# Patient Record
Sex: Female | Born: 1985 | Race: White | Hispanic: No | Marital: Married | Smoking: Never smoker
Health system: Southern US, Community
[De-identification: ages and names within clinical notes are randomized; demographics above are authoritative.]

## PROBLEM LIST (undated history)

## (undated) DIAGNOSIS — IMO0002 Reserved for concepts with insufficient information to code with codable children: Secondary | ICD-10-CM

## (undated) DIAGNOSIS — R87619 Unspecified abnormal cytological findings in specimens from cervix uteri: Secondary | ICD-10-CM

## (undated) HISTORY — DX: Unspecified abnormal cytological findings in specimens from cervix uteri: R87.619

## (undated) HISTORY — DX: Reserved for concepts with insufficient information to code with codable children: IMO0002

---
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
## 2003-08-29 ENCOUNTER — Inpatient Hospital Stay (HOSPITAL_COMMUNITY): Admission: EM | Admit: 2003-08-29 | Discharge: 2003-08-30 | Payer: Self-pay | Admitting: Emergency Medicine

## 2003-08-30 ENCOUNTER — Encounter: Payer: Self-pay | Admitting: Internal Medicine

## 2003-08-30 ENCOUNTER — Inpatient Hospital Stay (HOSPITAL_COMMUNITY): Admission: AD | Admit: 2003-08-30 | Discharge: 2003-09-02 | Payer: Self-pay | Admitting: Emergency Medicine

## 2004-01-13 ENCOUNTER — Other Ambulatory Visit: Admission: RE | Admit: 2004-01-13 | Discharge: 2004-01-13 | Payer: Self-pay | Admitting: Obstetrics and Gynecology

## 2005-11-30 ENCOUNTER — Encounter: Admission: RE | Admit: 2005-11-30 | Discharge: 2005-11-30 | Payer: Self-pay | Admitting: Internal Medicine

## 2007-06-13 ENCOUNTER — Other Ambulatory Visit: Admission: RE | Admit: 2007-06-13 | Discharge: 2007-06-13 | Payer: Self-pay | Admitting: Family Medicine

## 2008-09-22 ENCOUNTER — Ambulatory Visit: Payer: Self-pay | Admitting: Sports Medicine

## 2008-09-22 DIAGNOSIS — M779 Enthesopathy, unspecified: Secondary | ICD-10-CM | POA: Insufficient documentation

## 2008-09-22 DIAGNOSIS — M21969 Unspecified acquired deformity of unspecified lower leg: Secondary | ICD-10-CM | POA: Insufficient documentation

## 2008-09-22 DIAGNOSIS — M2170 Unequal limb length (acquired), unspecified site: Secondary | ICD-10-CM | POA: Insufficient documentation

## 2009-11-27 HISTORY — PX: AUGMENTATION MAMMAPLASTY: SUR837

## 2010-08-15 ENCOUNTER — Ambulatory Visit: Payer: Self-pay | Admitting: Internal Medicine

## 2010-08-15 DIAGNOSIS — F509 Eating disorder, unspecified: Secondary | ICD-10-CM | POA: Insufficient documentation

## 2010-08-15 DIAGNOSIS — F3289 Other specified depressive episodes: Secondary | ICD-10-CM | POA: Insufficient documentation

## 2010-08-15 DIAGNOSIS — K5900 Constipation, unspecified: Secondary | ICD-10-CM | POA: Insufficient documentation

## 2010-08-15 DIAGNOSIS — F329 Major depressive disorder, single episode, unspecified: Secondary | ICD-10-CM

## 2010-09-28 ENCOUNTER — Ambulatory Visit: Payer: Self-pay | Admitting: Internal Medicine

## 2010-12-27 NOTE — Assessment & Plan Note (Signed)
Summary: New    Vital Signs:  Patient profile:   25 year old female Height:      62 inches (157.48 cm) Weight:      127.0 pounds (57.73 kg) BMI:     23.31 O2 Sat:      95 % on Room air Temp:     97.0 degrees F (36.11 degrees C) oral Pulse rate:   74 / minute BP sitting:   102 / 60  (left arm) Cuff size:   regular  Vitals Entered By: Orlan Leavens RMA (August 15, 2010 3:19 PM)  O2 Flow:  Room air CC: New patient Is Patient Diabetic? No Pain Assessment Patient in pain? no        Primary Care Provider:  Newt Lukes MD  CC:  New patient.  History of Present Illness: new pt to me and our practice, here to est care  1) depression - hx same,  prev self tx with substance abuse - sober since 2009 on wellbutrin since start of 2011 - doing well - prior med trial prozac - "numb" so stopped med still spells of sadness and low energy - but much improved compared to pretx self also complicated by anxiety spells - no worse since starting tx reports compliance with ongoing medical treatment and no changes in medication dose or frequency. denies adverse side effects related to current therapy.  would like to eventually wean off med if possible  2) RLQ pain -  onset 3 weeks ago saw urg care for same - tx with miralax and fiber and improved but not resolved - no fever, no n/v, no radiation of pain - no missed menstral cycles no bowel changes - no melena or BRBPR  3) hx eating do - bulemia in college - prev laxative abuse - no purging or anorexia denies active weight concerns  Preventive Screening-Counseling & Management  Alcohol-Tobacco     Alcohol drinks/day: 0     Alcohol Counseling: not indicated; patient does not drink     Smoking Status: never     Tobacco Counseling: not indicated; no tobacco use  Caffeine-Diet-Exercise     Times/week: 6     Exercise Counseling: not indicated; exercise is adequate     Depression Counseling: further diagnostic testing and/or  other treatment is indicated  Current Medications (verified): 1)  Wellbutrin Xl 150 Mg Xr24h-Tab (Bupropion Hcl) .... Take 1 By Mouth Once Daily 2)  Calcium 600 600 Mg Tabs (Calcium Carbonate) .... Take 1 By Mouth Once Daily 3)  Flaxseed Oil 1000 Mg Caps (Flaxseed (Linseed)) .... Take 1 By Mouth Once Daily 4)  Vitamin D 1000 Unit Tabs (Cholecalciferol) .... Take 1 By Mouth Once Daily 5)  Multivitamins  Tabs (Multiple Vitamin) .... Take 1 By Mouth Once Daily  Allergies (verified): 1)  ! 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    Physical Exam  General:  alert,  well-developed, well-nourished, and cooperative to examination.    Eyes:  vision grossly intact; pupils equal, round and reactive to light.  conjunctiva and lids normal.    Lungs:  normal respiratory effort, no intercostal retractions or use of accessory muscles; normal breath sounds bilaterally - no crackles and no wheezes.    Heart:  normal rate, regular rhythm, no murmur, and no rub. BLE without edema. normal DP pulses and normal cap refill in all 4 extremities    Abdomen:  soft, non-tender, normal bowel sounds, no distention; no masses and no appreciable hepatomegaly or splenomegaly.   Psych:  Oriented X3, memory intact for recent and remote, normally interactive, good eye  contact, not anxious appearing, not depressed appearing, and not agitated.      Impression & Recommendations:  Problem # 1:  DEPRESSION (ICD-311)  well controlled at this time on current tx - substance abuse hx reviewed - cont AA also situational component of grief related to unexpected death of dad 03-16-2010 in MVA can revist weaning off med after holidays, winter and anniversary of dad's death if pt stilldesires but would cont same for now discussed w/ pt who agrees Her updated medication list for this problem includes:    Wellbutrin Xl 150 Mg Xr24h-tab (Bupropion hcl) .Marland Kitchen... Take 1 by mouth once daily  Orders: Prescription Created Electronically [phone])  Problem # 2:  CONSTIPATION (ICD-564.00) likely cause of RLQ pain - exam and hx benign - pt follows gluten free diet which helps with bloating and gas advised contined use of fiber and try probiotics -  Problem # 3:  Hx of EATING DISORDER (ICD-307.50) hx same in college - now controls watching diet and exercise - would need to consider weight neutral drug if considering alt med for tx depression/anxiety-- reviewed same with pt today  Time spent with patient 30 minutes, more than 50% of this time was spent counseling patient on depression, subst abuse hx and problems concerning the need for med adjustment over time  Complete Medication List: 1)  Wellbutrin Xl 150 Mg Xr24h-tab (Bupropion hcl) .... Take 1 by mouth once daily 2)  Calcium 600 600 Mg Tabs (Calcium carbonate) .... Take 1 by mouth once daily 3)  Flaxseed Oil 1000 Mg Caps (Flaxseed (linseed)) .... Take 1 by mouth once daily 4)  Vitamin D 1000 Unit Tabs (Cholecalciferol) .... Take 1 by mouth once daily 5)  Multivitamins Tabs (Multiple vitamin) .... Take 1 by mouth once daily 6)  Align Caps (Probiotic product) .Marland Kitchen.. 1 by mouth once daily  Patient Instructions: 1)  it was good to see you today. 2)  medications and history reviewed -  3)  try Align (sample+coupon provided)  or [employer] as discussed to help with your GI health 4)  continue generic wellbutrin - refills done - will discuss in spring about weaning off medication if time is right for you 5)  Please schedule a follow-up appointment in 6-9 months to review depression and medications, call sooner if problems.  Prescriptions: WELLBUTRIN XL 150 MG XR24H-TAB (BUPROPION HCL) take 1 by mouth once daily  #30 x 11   Entered and Authorized by:   Newt Lukes MD   Signed by:   Newt Lukes MD on 08/15/2010   Method used:   Electronically to        [facility]. #[phone]* (retail)       [address]       [place]  [place], [place]  [address]       Ph: [phone]       Fax: [phone]   RxID:   [id number]

## 2010-12-27 NOTE — Assessment & Plan Note (Signed)
Summary: PER DAHLIA FU ANXIETY  STC   Vital Signs:  Patient profile:   25 year old female Height:      62 inches (157.48 cm) Weight:      127 pounds (57.73 kg) O2 Sat:      97 % on Room air Temp:     97.4 degrees F (36.33 degrees C) oral Pulse rate:   70 / minute BP sitting:   92 / 62  (left arm) Cuff size:   regular  Vitals Entered By: Orlan Leavens RMA (September 28, 2010 8:54 AM)  O2 Flow:  Room air CC: F/U on anxiety Is Patient Diabetic? No Pain Assessment Patient in pain? no        Primary Care Provider:  Newt Lukes MD  CC:  F/U on anxiety.  History of Present Illness: here for f/u  depression - hx same- prev self tx with substance abuse - sober since 2009 on wellbutrin since start of 2011 - doing well until insertion of hormonal IUD few weeks ago- prior med trial prozac - "numb" so stopped med has improved spells of sadness and low energy initially-  overall much improved compared to pretx self now complicated by anxiety spells - much worse since new IUD would like to change to different medication for anxiety control but avoid BZ if possible no SI/HI  hx eating do - bulemia in college - prev laxative abuse - no purging or anorexia denies active weight concerns  Current Medications (verified): 1)  Wellbutrin Xl 150 Mg Xr24h-Tab (Bupropion Hcl) .... Take 1 By Mouth Once Daily 2)  Calcium 600 600 Mg Tabs (Calcium Carbonate) .... Take 1 By Mouth Once Daily 3)  Flaxseed Oil 1000 Mg Caps (Flaxseed (Linseed)) .... Take 1 By Mouth Once Daily 4)  Vitamin D 1000 Unit Tabs (Cholecalciferol) .... Take 1 By Mouth Once Daily 5)  Phillips Colon Health  Caps (Probiotic Product) .... Take 1 By Mouth Once Daily  Allergies (verified): 1)  ! Penicillin  Past History:  Past Medical History: Depression/anxiety hx subst abuse - sober 2009 hx eating disorder  MD roster: gyn - carol curtis, [facility]; prev [facility]  Review of Systems  The patient denies  weight loss, chest pain, and headaches.    Physical Exam  General:  alert, well-developed, well-nourished, and cooperative to examination.    Psych:  Oriented X3, memory intact for recent and remote, normally interactive, good eye contact, min anxious appearing, not depressed appearing, and not agitated.      Impression & Recommendations:  Problem # 1:  DEPRESSION (ICD-311)  Her updated medication list for this problem includes:    Bupropion Hcl 75 Mg Tabs (Bupropion hcl) .Marland Kitchen... 1 by mouth once daily x 2 weeks (or as discussed)    Venlafaxine Hcl 37.5 Mg Xr24h-tab (Venlafaxine hcl) .Marland Kitchen... 1 by mouth once daily x 2 weeks then 1 by mouth two times a day (or as directed)  exac of anxiety at this time on current tx - ?related to new IUD - will d/w gyn re: same possibility substance abuse hx reviewed - cont AA also situational component of grief related to unexpected death of dad Mar 16, 2010 in MVA will transitionmed from wellbutrin to effexor - low dose of each to help transition - erx done can revist weaning off med after holidays, winter and anniversary of dad's death if pt stilldesires but would cont same for now discussed w/ pt who agrees  Orders: Administrator, arts (  Chance.Elder)  Complete Medication List: 1)  Bupropion Hcl 75 Mg Tabs (Bupropion hcl) .Marland Kitchen.. 1 by mouth once daily x 2 weeks (or as discussed) 2)  Calcium 600 600 Mg Tabs (Calcium carbonate) .... Take 1 by mouth once daily 3)  Flaxseed Oil 1000 Mg Caps (Flaxseed (linseed)) .... Take 1 by mouth once daily 4)  Vitamin D 1000 Unit Tabs (Cholecalciferol) .... Take 1 by mouth once daily 5)  Phillips Colon Health Caps (Probiotic product) .... Take 1 by mouth once daily 6)  Venlafaxine Hcl 37.5 Mg Xr24h-tab (Venlafaxine hcl) .Marland Kitchen.. 1 by mouth once daily x 2 weeks then 1 by mouth two times a day (or as directed)  Patient Instructions: 1)  it was good to see you today. 2)  medications and history reviewed -  3)  wean off  wellbutrin and start effexor for anxiety as discussed (see below) - your prescriptions have been electronically submitted to your pharmacy. Please take as directed. Contact our office if you believe you're having problems with the medication(s).  4)  continue [facility] as previously discussed to help with your GI health 5)  talk with gynecology about your IUD 6)  we will discuss in spring 2012 about weaning off medications if time is right for you 7)  Please schedule a follow-up appointment in 4 weeks to review depression/anxiety and medications, call sooner if problems.  Prescriptions: VENLAFAXINE HCL 37.5 MG XR24H-TAB (VENLAFAXINE HCL) 1 by mouth once daily x 2 weeks then 1 by mouth two times a day (or as directed)  #60 x 1   Entered and Authorized by:   Newt Lukes MD   Signed by:   Newt Lukes MD on 09/28/2010   Method used:   Electronically to        [facility] [phone]* (retail)       [address]       [place], [place]  [address]       Ph: [phone]       Fax: [phone]   RxID:   [id number] BUPROPION HCL 75 MG TABS (BUPROPION HCL) 1 by mouth once daily x 2 weeks (or as discussed)  #30 x 0   Entered and Authorized by:   Newt Lukes MD   Signed by:   Newt Lukes MD on 09/28/2010   Method used:   Electronically to        [facility] [phone]* (retail)       [address]       [place], [place]  [address]       Ph: [phone]       Fax: [phone]   RxID:   [id number]    Orders Added: 1)  Est. Patient Level IV [[address]] 2)  Prescription Created Electronically [phone]

## 2011-04-14 NOTE — Discharge Summary (Signed)
NAME:  Colleen Harris, Colleen Harris NO.:  [id number]   MEDICAL RECORD NO.:  [id number]                   PATIENT TYPE:  IPS   LOCATION:  0301                                 FACILITY:  [facility]   PHYSICIAN:  Jeanice Lim, M.D.              DATE OF BIRTH:  [birth year]   DATE OF ADMISSION:  08/30/2003  DATE OF DISCHARGE:  09/02/2003                                 DISCHARGE SUMMARY   IDENTIFYING DATA:  A 25 year old Caucasian female voluntarily admitted in  transfer from medicine after overdosing on two Ritalin, 10 Strattera, 24  Tylenol.  Tylenol level was 130 in the morning serum.  Overdose spaced out  over a period of time.  Reported two weeks of increased stress, decreased  sleep, and increased alcohol use drinking in two binges.  Today reporting  being remorseful at the time of admission regarding the overdose.   MEDICATIONS:  Strattera, does not think it helps, and Ritalin but wonders if  it makes things worse.   DRUG ALLERGIES:  No known drug allergies.   PHYSICAL EXAMINATION:  GENERAL:  Essentially within normal limits.  NEUROLOGIC:  Nonfocal.   LABORATORY DATA:  Routine admission labs within normal limits.   MENTAL STATUS EXAM:  Fully alert pleasant, polite, petite, good hygiene.  Affect appropriate.  Speech within normal limits.  Mood:  Euthymic.  Thought  process:  Goal directed.  Negative for dangerous ideation or psychotic  symptoms.  The patient reported being busy with planning time and how to  cope with stressors, still recovering from Tylenol overdose.   ADMISSION DIAGNOSES:   AXIS I:  Adjustment disorder, not otherwise specified versus substance-  induced mood disorder, cannabis abuse.   AXIS II:  None.   AXIS III:  Status post acetaminophen overdose and hyperkalemia.   AXIS IV:  Moderate stressors related to primary support system and social  environment.   AXIS V:  30/70.   HOSPITAL COURSE:  The patient was admitted, ordered routine  p.r.n.  medications, underwent further monitoring, and was encouraged to participate  in individual, group, and milieu therapy.  The patient is status post near-  fatal overdose on Tylenol, had two binges, has a history of ADD and reports  that she may do best just with therapy and avoiding substance use, showing  an increased insight regarding impact of substance use on her judgment,  mood.  The patient participated in treatment, participated in aftercare  planning, and reported a gradual improvement in mood, as well as planned  good followup system with [employer].  The patient was discharged in improved  condition.  Mood euthymic, affect brighter, thought process goal directed,  thought content negative for dangerous ideation or psychotic symptoms.  The  patient was discharged on no psychotropics, was advised to avoid alcohol and  get therapy regarding ADD and stress management.  The patient was to call  the  psychology clinic at [facility] and schedule an appointment with them in 7-10  days.   DISCHARGE DIAGNOSES:   AXIS I:  Adjustment disorder, not otherwise specified versus substance-  induced mood disorder, cannabis abuse.   AXIS II:  None.   AXIS III:  Status post acetaminophen overdose and hyperkalemia.   AXIS IV:  Moderate stressors related to primary support system and social  environment.   AXIS V:  GAF on discharge 55.                                               Jeanice Lim, M.D.    JEM/MEDQ  D:  09/30/2003  T:  10/01/2003  Job:  [id number]

## 2011-04-14 NOTE — H&P (Signed)
NAME:  Colleen Harris, Colleen Harris NO.:  [id number]   MEDICAL RECORD NO.:  [id number]                   PATIENT TYPE:  EMS   LOCATION:  MINO                                 FACILITY:  [facility]   PHYSICIAN:  Hollice Espy, M.D.            DATE OF BIRTH:  04-28-1986   DATE OF ADMISSION:  08/29/2003  DATE OF DISCHARGE:                                HISTORY & PHYSICAL   HISTORY OF PRESENT ILLNESS:  This is a 25 year old white female with a past  medical history of ADHD who presents after taking an overdose on her ADHD  medications as well as Tylenol.  The patient states that she had been doing  well, but she had been under a lot of stress at school and things initially  started out well, but they had quickly gone downhill to the point where she  felt that she just could not deal with this anymore.  She took two of her  methylphenidate, ten of Strattera which is a norepinephrine reuptake  inhibitor for ADHD, two ephedra diet pills which she had been taking on  again and off again and 24 Tylenol.  This all occurred last night spaced out  at different times.  The Tylenol was taken per the patient at approximately  2 a.m.  She then felt very tired and passed out on the [place] her  camp[place].  Her roommate reported her as missing and called her mother.  The  patient was found and brought into the ED this morning.  She is completely  alert, awake and oriented.  She complained of feeling nauseous and slightly  jumpy.  In the time in the ER her Tylenol level was found to be 130.  Her  LFTs were within normal limits and the rest of her laboratory work including  a CBC and CHem-7 were all normal.  She was started on N-acetylcysteine and  received 8 grams at approximately 11:30 a.m.  She also received some  Phenergan 12.5 mg which did help with her nausea.  Currently, the patient  states that she feels tired although she feels like she does have some  palpitations.  She  denies any abdominal pain.  She complains of some mild  nausea.  Otherwise, she has no vision changes, hearing changes, trouble  swallowing, chest pain, shortness of breath, wheezing, cough, abdominal  pain, fevers, chills, hematuria, dysuria, constipation or diarrhea.   PAST MEDICAL HISTORY:  1. She has ADHD.  Once when she was much younger she accidentally ingested a     bottle of Pepto-Bismol at age 25.   MEDICATIONS:  1. Strattera.  2. Birth control pills.  3. Hydroxycut which is an ephedrine diet drug derivative occasionally.   ALLERGIES:  1. PENICILLIN.   SOCIAL HISTORY:  She denies any type of heavy IV drug use.  She does state  that she smokes marijuana occasionally.  She occasionally smokes cigarettes  and she occasionally drinks alcohol, but not excessively.   FAMILY HISTORY:  Noncontributory.   PHYSICAL EXAMINATION:  VITAL SIGNS ON ADMISSION:  She is afebrile,  tachycardic with a ventricular rate of 105, respirations of 18, 02  saturations are 99% on room air.  GENERAL: She is in no apparent distress and is alert and oriented times  three and pleasant.  HEENT:  Her mucous membranes are dry.  She has no carotid bruits.  HEART:  Regular rhythm with mild tachycardia.  LUNGS:  Clear to auscultation bilaterally.  ABDOMEN:  Soft, non-tender and non-distended with positive bowel sounds.  She has no organomegaly.  EXTREMITIES:  She has no cyanosis, clubbing or edema.   LABORATORY DATA:  Alcohol level was less than 5.0 which is normal.  Tylenol  level is 130.7 with the normal range of 10 to 30. Salicylate level is less  than 4.0 which is normal.  Urine pregnancy test is negative.  CBC:  White  count is 4.0 with a 59% shift, hemoglobin 12.7, hematocrit 37.0, MCV 90,  platelet count 184,000.  PT 12.8, INR 0.9 and PTT 28.0, all normal.  Urine  drug screen is positive only for THC.  Her pH is 7.41, pC02 is 35.1 and  bicarbonate is 22.  Sodium 137, potassium 3.8, chloride 117,  bicarbonate 22.  Total bilirubin 0.3, direct bilirubin less than 0.1, alkaline phosphatase  81, AST 33, ALT 24, total protein 7.5, albumin 3.9, all within normal  limits.   ASSESSMENT AND PLAN:  1. 25-year-old white female who presents after taking two Concerta,     ten Strattera, two ephedrine and 24 Tylenol as a suicide attempt     secondary to stress with a noted elevated Tylenol level.  The patient has     been started on N-acetylcysteine.  After an initial dose we will give 4     grams of NAC times 17 doses q.4h.  We will continue to follow levels and     liver function tests and telemetry bed. We will ask psychiatry to see.     We will treat her nausea with Phenergan.  We will give her a sitter.  She     appears relatively medically stable for now.  In regards to the other     medications she took which accounts for her tachycardia we will just give     her IV fluids and let it just work out of her system and follow her with     a telemetry bed.  She may need some inpatient psychiatric placement and     we will allow psychiatry to determine this.                                                Hollice Espy, M.D.    SKK/MEDQ  D:  08/29/2003  T:  08/30/2003  Job:  [id number]

## 2011-04-14 NOTE — Discharge Summary (Signed)
NAME:  Colleen Harris, Colleen Harris NO.:  [id number]   MEDICAL RECORD NO.:  [id number]                   PATIENT TYPE:  INP   LOCATION:  6704                                 FACILITY:  [facility]   PHYSICIAN:  Hollice Espy, M.D.            DATE OF BIRTH:  Mar 21, 1986   DATE OF ADMISSION:  08/29/2003  DATE OF DISCHARGE:  08/30/2003                                 DISCHARGE SUMMARY   PRIMARY PHYSICIAN:  Dr. Elana Alm. Reade.   DISCHARGE DIAGNOSIS:  Tylenol overdose.   DISCHARGE MEDICATIONS:  1. Mucomyst 4 g p.o. q.4 h. -- continue this until final dose at noon on     Tuesday, September 01, 2003.  2. The patient may resume birth control pills in one week.  3. She may resume her Strattera medications for her ADHD as per her primary     care doctor.  4. She is advised to stop taking her ephedrine diet pills.   DIET:  Her diet is regular.   ACTIVITY:  Activity is as tolerated.   HOSPITAL COURSE:  This is a 25 year old white female with past medical  history of ADHD, who presented after taking an overdose of  Strattera/Concerta, which are medications for ADHD, plus 2 ephedra and 24  Tylenol.  The patient stated that she had been in college and was undergoing  some stresses and felt like she could not take it any more.  She consumed  these medications approximately about 2 a.m. on the morning of August 29, 2003.  She was notified missing by her roommate.  Her mother was called and  she was found and brought into the ED.  Her LFTs were checked and were  normal.  Her Tylenol level was elevated at 130.  She received 8 g of  Mucomyst as well as IV fluids.  She was awake and alert and conscious during  her entire time when she was admitted.  She reported no other problems.  On  admission, she was noted to be slightly tachycardic, which was felt to be  secondary to her ADHD medications plus the ephedrine.  She was given IV  fluids and continued to do well.  By 5 p.m. on  August 29, 2003, her Tylenol  level was normal; her LFTs were never elevated.  She was felt to be  medically stable.  The ACT team and Behavioral Health were called and  assessed the patient and she was seen by a psychiatrist today on August 30, 2003.  She is felt to be medically stable for discharge and was accepted for  transfer on the evening of August 30, 2003.  She will continue on her  Mucomyst, receiving Mucomyst every four hours since her admission due to her  elevated initial Tylenol level, until 17 additional doses are complete; this  will be done at noon on Tuesday, September 01, 2003.  DISPOSITION:  Improved.  She will be discharged to [facility].   FOLLOWUP:  She is advised to follow up with her PCP, Dr. Elias Else,  within the first week after her discharge.    DISCHARGE INSTRUCTIONS:  She may resume normal activity at that time.  She  is additionally advised, as her birth control pills are usually hepatically  cleared, to restart her birth control pills, either as per her PCP or one  week after she is discharged.  She is advised not to resume sexual activity  for the next few weeks until her birth control pills are at a regulated  level.                                                Hollice Espy, M.D.    SKK/MEDQ  D:  08/30/2003  T:  08/31/2003  Job:  [id number]

## 2011-07-29 HISTORY — PX: BREAST SURGERY: SHX581

## 2012-02-09 ENCOUNTER — Ambulatory Visit (INDEPENDENT_AMBULATORY_CARE_PROVIDER_SITE_OTHER): Payer: BC Managed Care – PPO | Admitting: Physician Assistant

## 2012-02-09 DIAGNOSIS — Z111 Encounter for screening for respiratory tuberculosis: Secondary | ICD-10-CM

## 2012-02-09 NOTE — Progress Notes (Signed)
   Patient ID: MCKAILA DUFFUS MRN: [id number], DOB: 1986-10-14, 26 y.o. Date of Encounter: 02/09/2012, 12:29 PM  Primary Physician: Rene Paci, MD, MD  Chief Complaint: PPD reading  HPI: 26 y.o. year old female here for PPD reading. Had PPD placed at [facility] employee health. Just got hired on there. PPD placed on 02/07/12. Asymptomatic. Never with a positive PPD. She is doing well and without issues or complaints. She requests that we fax her PPD results and TDaP immunization record to [facility] as well. She has filled out the authorization form.   No past medical history on file.   Home Meds: Prior to Admission medications   Not on File    Allergies:  Allergies  Allergen Reactions  . Penicillins     History   Social History  . Marital Status: Married    Spouse Name: N/A    Number of Children: N/A  . Years of Education: N/A   Occupational History  . Not on file.   Social History Main Topics  . Smoking status: Not on file  . Smokeless tobacco: Not on file  . Alcohol Use: Not on file  . Drug Use: Not on file  . Sexually Active: Not on file   Other Topics Concern  . Not on file   Social History Narrative  . No narrative on file     Review of Systems: Non-contributory   Physical Exam: There were no vitals taken for this visit., There is no height or weight on file to calculate BMI. General: Well developed, well nourished, in no acute distress. Head: Normocephalic, atraumatic, eyes without discharge, sclera non-icteric, nares are without discharge.   Neck: Supple.  Lungs: Breathing is unlabored. Heart: Regular rate Msk:  Strength and tone normal for age. Extremities/Skin: Warm and dry. No clubbing or cyanosis. No edema. No rashes or suspicious lesions. Neuro: Alert and oriented X 3. Moves all extremities spontaneously. Gait is normal. CNII-XII grossly in tact. Psych:  Responds to questions appropriately with a normal affect.     ASSESSMENT AND PLAN:    26 y.o. year old female here for PPD reading. -PPD 0 mm induration -Form completed and faxed to [facility] -TDaP immunization record faxed to [facility] as well   Signed, Eula Listen, PA-C 02/09/2012 12:29 PM

## 2012-08-02 ENCOUNTER — Encounter: Payer: Self-pay | Admitting: Obstetrics and Gynecology

## 2012-08-02 ENCOUNTER — Ambulatory Visit (INDEPENDENT_AMBULATORY_CARE_PROVIDER_SITE_OTHER): Payer: Self-pay | Admitting: Obstetrics and Gynecology

## 2012-08-02 VITALS — BP 93/57 | HR 64 | Ht 62.0 in | Wt 131.1 lb

## 2012-08-02 DIAGNOSIS — Z124 Encounter for screening for malignant neoplasm of cervix: Secondary | ICD-10-CM

## 2012-08-02 DIAGNOSIS — Z01419 Encounter for gynecological examination (general) (routine) without abnormal findings: Secondary | ICD-10-CM

## 2012-08-02 NOTE — Progress Notes (Signed)
  Subjective:     Colleen Harris is a 26 y.o. female G0 with amenorrhea secondary to Mirena IUD (since 2009) and BMI 23 who is here for a comprehensive physical exam. The patient reports no problems. She is sexually active with her husband without any issues. She is planning on removing the IUD in the next few months in order to achieve a pregnancy. Patient is without any complaints  History   Social History  . Marital Status: Married    Spouse Name: N/A    Number of Children: N/A  . Years of Education: N/A   Occupational History  . Not on file.   Social History Main Topics  . Smoking status: Never Smoker   . Smokeless tobacco: Not on file  . Alcohol Use: No  . Drug Use: No  . Sexually Active: Yes    Birth Control/ Protection: IUD   Other Topics Concern  . Not on file   Social History Narrative  . No narrative on file   Health Maintenance  Topic Date Due  . Pap Smear  04/30/2002  . Tetanus/tdap  05/01/2003  . Influenza Vaccine  08/27/2012   Past Medical History  Diagnosis Date  . Abnormal Pap smear     HPV had colpo 2011   Past Surgical History  Procedure Date  . Breast surgery 07/29/2011    AUGMENTATION        Review of Systems A comprehensive review of systems was negative.   Objective:     GENERAL: Well-developed, well-nourished female in no acute distress.  HEENT: Normocephalic, atraumatic. Sclerae anicteric.  NECK: Supple. Normal thyroid.  LUNGS: Clear to auscultation bilaterally.  HEART: Regular rate and rhythm. BREASTS: Symmetric in size. No palpable masses or lymphadenopathy, skin changes, or nipple drainage. Palpable implants ABDOMEN: Soft, nontender, nondistended. No organomegaly. PELVIC: Normal external female genitalia. Vagina is pink and rugated.  Normal discharge. Normal appearing cervix. IUD strings visulaized. Uterus is normal in size. No adnexal mass or tenderness. EXTREMITIES: No cyanosis, clubbing, or edema, 2+ distal pulses.    Assessment:     Healthy female exam.      Plan:    Pap smear collected Patient advised to perform monthly self breast and vulva exam  Patient advised to take prenatal vitamins prior to conception See After Visit Summary for Counseling Recommendations

## 2012-08-02 NOTE — Patient Instructions (Signed)
Preventive Care for Adults, Female A healthy lifestyle and preventive care can promote health and wellness. Preventive health guidelines for women include the following key practices.  A routine yearly physical is a good way to check with your caregiver about your health and preventive screening. It is a chance to share any concerns and updates on your health, and to receive a thorough exam.   Visit your dentist for a routine exam and preventive care every 6 months. Brush your teeth twice a day and floss once a day. Good oral hygiene prevents tooth decay and gum disease.   The frequency of eye exams is based on your age, health, family medical history, use of contact lenses, and other factors. Follow your caregiver's recommendations for frequency of eye exams.   Eat a healthy diet. Foods like vegetables, fruits, whole grains, low-fat dairy products, and lean protein foods contain the nutrients you need without too many calories. Decrease your intake of foods high in solid fats, added sugars, and salt. Eat the right amount of calories for you.Get information about a proper diet from your caregiver, if necessary.   Regular physical exercise is one of the most important things you can do for your health. Most adults should get at least 150 minutes of moderate-intensity exercise (any activity that increases your heart rate and causes you to sweat) each week. In addition, most adults need muscle-strengthening exercises on 2 or more days a week.   Maintain a healthy weight. The body mass index (BMI) is a screening tool to identify possible weight problems. It provides an estimate of body fat based on height and weight. Your caregiver can help determine your BMI, and can help you achieve or maintain a healthy weight.For adults 20 years and older:   A BMI below 18.5 is considered underweight.   A BMI of 18.5 to 24.9 is normal.   A BMI of 25 to 29.9 is considered overweight.   A BMI of 30 and above is  considered obese.   Maintain normal blood lipids and cholesterol levels by exercising and minimizing your intake of saturated fat. Eat a balanced diet with plenty of fruit and vegetables. Blood tests for lipids and cholesterol should begin at age 20 and be repeated every 5 years. If your lipid or cholesterol levels are high, you are over 50, or you are at high risk for heart disease, you may need your cholesterol levels checked more frequently.Ongoing high lipid and cholesterol levels should be treated with medicines if diet and exercise are not effective.   If you smoke, find out from your caregiver how to quit. If you do not use tobacco, do not start.   If you are pregnant, do not drink alcohol. If you are breastfeeding, be very cautious about drinking alcohol. If you are not pregnant and choose to drink alcohol, do not exceed 1 drink per day. One drink is considered to be 12 ounces (355 mL) of beer, 5 ounces (148 mL) of wine, or 1.5 ounces (44 mL) of liquor.   Avoid use of street drugs. Do not share needles with anyone. Ask for help if you need support or instructions about stopping the use of drugs.   High blood pressure causes heart disease and increases the risk of stroke. Your blood pressure should be checked at least every 1 to 2 years. Ongoing high blood pressure should be treated with medicines if weight loss and exercise are not effective.   If you are 55 to 28   years old, ask your caregiver if you should take aspirin to prevent strokes.   Diabetes screening involves taking a blood sample to check your fasting blood sugar level. This should be done once every 3 years, after age 45, if you are within normal weight and without risk factors for diabetes. Testing should be considered at a younger age or be carried out more frequently if you are overweight and have at least 1 risk factor for diabetes.   Breast cancer screening is essential preventive care for women. You should practice "breast  self-awareness." This means understanding the normal appearance and feel of your breasts and may include breast self-examination. Any changes detected, no matter how small, should be reported to a caregiver. Women in their 20s and 30s should have a clinical breast exam (CBE) by a caregiver as part of a regular health exam every 1 to 3 years. After age 40, women should have a CBE every year. Starting at age 40, women should consider having a mammography (breast X-ray test) every year. Women who have a family history of breast cancer should talk to their caregiver about genetic screening. Women at a high risk of breast cancer should talk to their caregivers about having magnetic resonance imaging (MRI) and a mammography every year.   The Pap test is a screening test for cervical cancer. A Pap test can show cell changes on the cervix that might become cervical cancer if left untreated. A Pap test is a procedure in which cells are obtained and examined from the lower end of the uterus (cervix).   Women should have a Pap test starting at age 21.   Between ages 21 and 29, Pap tests should be repeated every 2 years.   Beginning at age 30, you should have a Pap test every 3 years as long as the past 3 Pap tests have been normal.   Some women have medical problems that increase the chance of getting cervical cancer. Talk to your caregiver about these problems. It is especially important to talk to your caregiver if a new problem develops soon after your last Pap test. In these cases, your caregiver may recommend more frequent screening and Pap tests.   The above recommendations are the same for women who have or have not gotten the vaccine for human papillomavirus (HPV).   If you had a hysterectomy for a problem that was not cancer or a condition that could lead to cancer, then you no longer need Pap tests. Even if you no longer need a Pap test, a regular exam is a good idea to make sure no other problems are  starting.   If you are between ages 65 and 70, and you have had normal Pap tests going back 10 years, you no longer need Pap tests. Even if you no longer need a Pap test, a regular exam is a good idea to make sure no other problems are starting.   If you have had past treatment for cervical cancer or a condition that could lead to cancer, you need Pap tests and screening for cancer for at least 20 years after your treatment.   If Pap tests have been discontinued, risk factors (such as a new sexual partner) need to be reassessed to determine if screening should be resumed.   The HPV test is an additional test that may be used for cervical cancer screening. The HPV test looks for the virus that can cause the cell changes on the cervix.   The cells collected during the Pap test can be tested for HPV. The HPV test could be used to screen women aged 30 years and older, and should be used in women of any age who have unclear Pap test results. After the age of 30, women should have HPV testing at the same frequency as a Pap test.   Colorectal cancer can be detected and often prevented. Most routine colorectal cancer screening begins at the age of 50 and continues through age 75. However, your caregiver may recommend screening at an earlier age if you have risk factors for colon cancer. On a yearly basis, your caregiver may provide home test kits to check for hidden blood in the stool. Use of a small camera at the end of a tube, to directly examine the colon (sigmoidoscopy or colonoscopy), can detect the earliest forms of colorectal cancer. Talk to your caregiver about this at age 50, when routine screening begins. Direct examination of the colon should be repeated every 5 to 10 years through age 75, unless early forms of pre-cancerous polyps or small growths are found.   Hepatitis C blood testing is recommended for all people born from 1945 through 1965 and any individual with known risks for hepatitis C.    Practice safe sex. Use condoms and avoid high-risk sexual practices to reduce the spread of sexually transmitted infections (STIs). STIs include gonorrhea, chlamydia, syphilis, trichomonas, herpes, HPV, and human immunodeficiency virus (HIV). Herpes, HIV, and HPV are viral illnesses that have no cure. They can result in disability, cancer, and death. Sexually active women aged 25 and younger should be checked for chlamydia. Older women with new or multiple partners should also be tested for chlamydia. Testing for other STIs is recommended if you are sexually active and at increased risk.   Osteoporosis is a disease in which the bones lose minerals and strength with aging. This can result in serious bone fractures. The risk of osteoporosis can be identified using a bone density scan. Women ages 65 and over and women at risk for fractures or osteoporosis should discuss screening with their caregivers. Ask your caregiver whether you should take a calcium supplement or vitamin D to reduce the rate of osteoporosis.   Menopause can be associated with physical symptoms and risks. Hormone replacement therapy is available to decrease symptoms and risks. You should talk to your caregiver about whether hormone replacement therapy is right for you.   Use sunscreen with sun protection factor (SPF) of 30 or more. Apply sunscreen liberally and repeatedly throughout the day. You should seek shade when your shadow is shorter than you. Protect yourself by wearing long sleeves, pants, a wide-brimmed hat, and sunglasses year round, whenever you are outdoors.   Once a month, do a whole body skin exam, using a mirror to look at the skin on your back. Notify your caregiver of new moles, moles that have irregular borders, moles that are larger than a pencil eraser, or moles that have changed in shape or color.   Stay current with required immunizations.   Influenza. You need a dose every fall (or winter). The composition of  the flu vaccine changes each year, so being vaccinated once is not enough.   Pneumococcal polysaccharide. You need 1 to 2 doses if you smoke cigarettes or if you have certain chronic medical conditions. You need 1 dose at age 65 (or older) if you have never been vaccinated.   Tetanus, diphtheria, pertussis (Tdap, Td). Get 1 dose of   Tdap vaccine if you are younger than age 65, are over 65 and have contact with an infant, are a healthcare worker, are pregnant, or simply want to be protected from whooping cough. After that, you need a Td booster dose every 10 years. Consult your caregiver if you have not had at least 3 tetanus and diphtheria-containing shots sometime in your life or have a deep or dirty wound.   HPV. You need this vaccine if you are a woman age 26 or younger. The vaccine is given in 3 doses over 6 months.   Measles, mumps, rubella (MMR). You need at least 1 dose of MMR if you were born in 1957 or later. You may also need a second dose.   Meningococcal. If you are age 19 to 21 and a first-year college student living in a residence hall, or have one of several medical conditions, you need to get vaccinated against meningococcal disease. You may also need additional booster doses.   Zoster (shingles). If you are age 60 or older, you should get this vaccine.   Varicella (chickenpox). If you have never had chickenpox or you were vaccinated but received only 1 dose, talk to your caregiver to find out if you need this vaccine.   Hepatitis A. You need this vaccine if you have a specific risk factor for hepatitis A virus infection or you simply wish to be protected from this disease. The vaccine is usually given as 2 doses, 6 to 18 months apart.   Hepatitis B. You need this vaccine if you have a specific risk factor for hepatitis B virus infection or you simply wish to be protected from this disease. The vaccine is given in 3 doses, usually over 6 months.  Preventive Services /  Frequency Ages 19 to 39  Blood pressure check.** / Every 1 to 2 years.   Lipid and cholesterol check.** / Every 5 years beginning at age 20.   Clinical breast exam.** / Every 3 years for women in their 20s and 30s.   Pap test.** / Every 2 years from ages 21 through 29. Every 3 years starting at age 30 through age 65 or 70 with a history of 3 consecutive normal Pap tests.   HPV screening.** / Every 3 years from ages 30 through ages 65 to 70 with a history of 3 consecutive normal Pap tests.   Hepatitis C blood test.** / For any individual with known risks for hepatitis C.   Skin self-exam. / Monthly.   Influenza immunization.** / Every year.   Pneumococcal polysaccharide immunization.** / 1 to 2 doses if you smoke cigarettes or if you have certain chronic medical conditions.   Tetanus, diphtheria, pertussis (Tdap, Td) immunization. / A one-time dose of Tdap vaccine. After that, you need a Td booster dose every 10 years.   HPV immunization. / 3 doses over 6 months, if you are 26 and younger.   Measles, mumps, rubella (MMR) immunization. / You need at least 1 dose of MMR if you were born in 1957 or later. You may also need a second dose.   Meningococcal immunization. / 1 dose if you are age 19 to 21 and a first-year college student living in a residence hall, or have one of several medical conditions, you need to get vaccinated against meningococcal disease. You may also need additional booster doses.   Varicella immunization.** / Consult your caregiver.   Hepatitis A immunization.** / Consult your caregiver. 2 doses, 6 to 18 months   apart.   Hepatitis B immunization.** / Consult your caregiver. 3 doses usually over 6 months.    ** Family history and personal history of risk and conditions may change your caregiver's recommendations. Document Released: 01/09/2002 Document Revised: 11/02/2011 Document Reviewed: 04/10/2011 [employer] Patient Information 2012 [employer]. 

## 2012-08-02 NOTE — Progress Notes (Signed)
New patient here for yearly physical and pap smear.

## 2012-08-26 ENCOUNTER — Ambulatory Visit: Payer: Self-pay | Admitting: Family Medicine

## 2012-08-28 ENCOUNTER — Ambulatory Visit: Payer: Self-pay | Admitting: Family Medicine

## 2017-12-21 ENCOUNTER — Other Ambulatory Visit: Payer: Self-pay | Admitting: Obstetrics & Gynecology

## 2017-12-21 DIAGNOSIS — N644 Mastodynia: Secondary | ICD-10-CM

## 2017-12-28 ENCOUNTER — Ambulatory Visit
Admission: RE | Admit: 2017-12-28 | Discharge: 2017-12-28 | Disposition: A | Discharge status: Home / Self Care | Payer: Managed Care, Other (non HMO) | Source: Ambulatory Visit | Attending: Obstetrics & Gynecology | Admitting: Obstetrics & Gynecology

## 2017-12-28 DIAGNOSIS — N644 Mastodynia: Secondary | ICD-10-CM

## 2018-08-09 IMAGING — MG 2D DIGITAL DIAGNOSTIC BILATERAL MAMMOGRAM WITH IMPLANTS, CAD AND
8 of 19 series · 8 of 39 positions shown · non-contrast
Comparison: Previous exam(s).

CLINICAL DATA: 31-year-old female with focal right breast pain and
heaviness for several weeks.

EXAM:
2D DIGITAL DIAGNOSTIC BILATERAL MAMMOGRAM WITH IMPLANTS, CAD AND
ADJUNCT TOMO
ULTRASOUND RIGHT BREAST
The patient has retropectoral implants. Standard and implant
displaced views were performed.

[L CC]
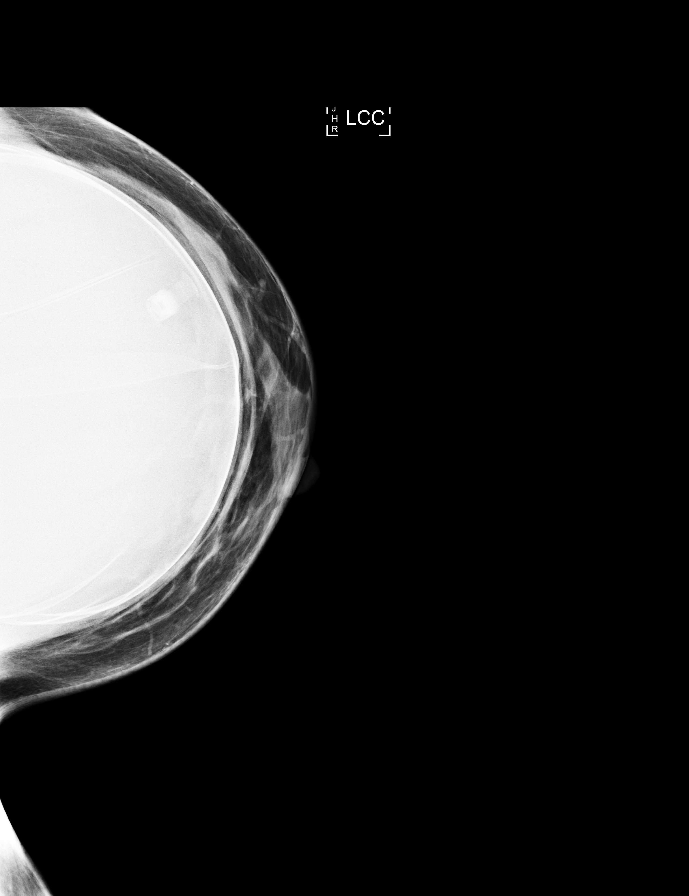

[L MLO]
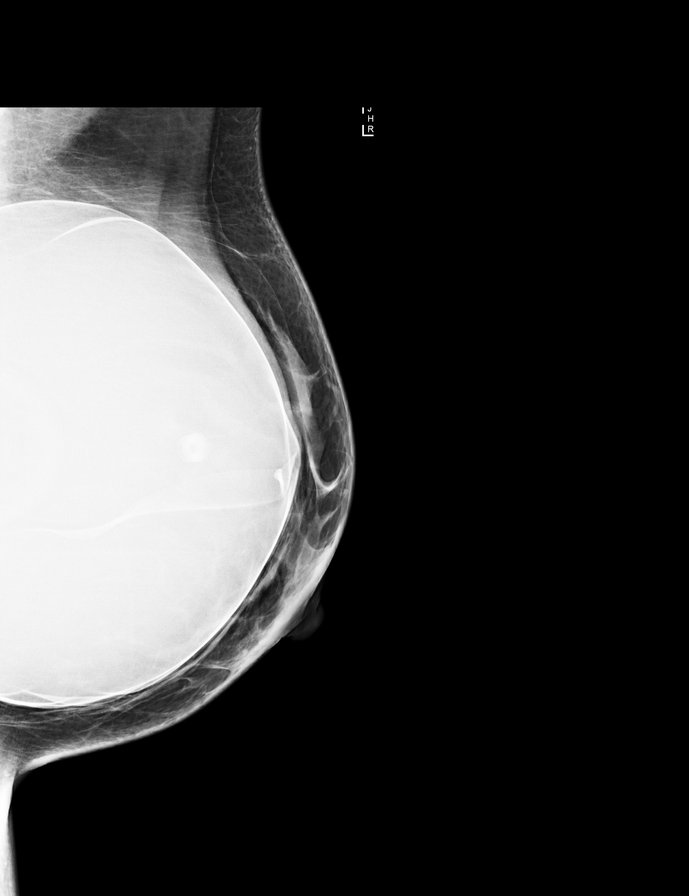

[R MLO]
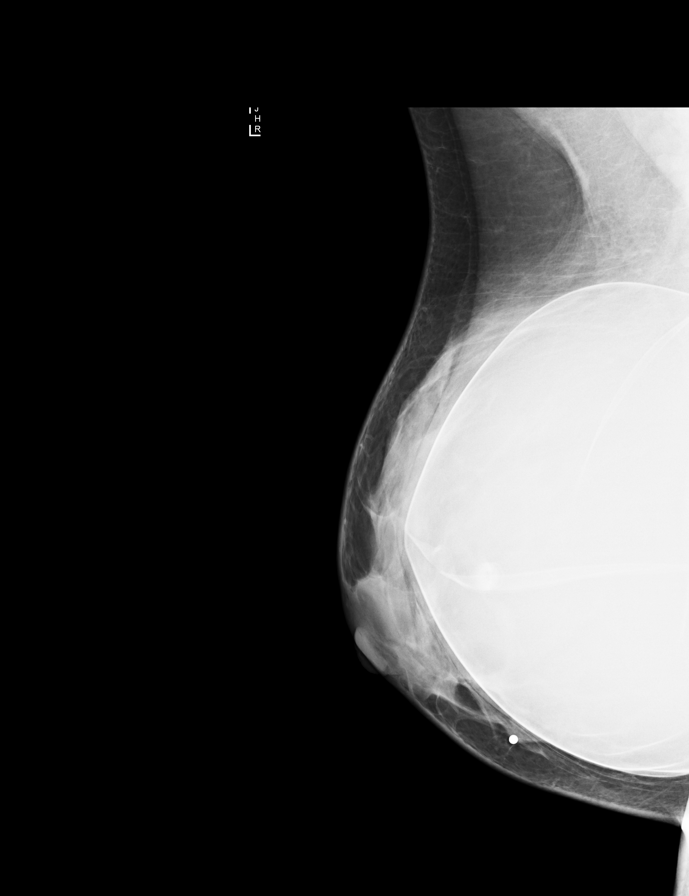

[R CC]
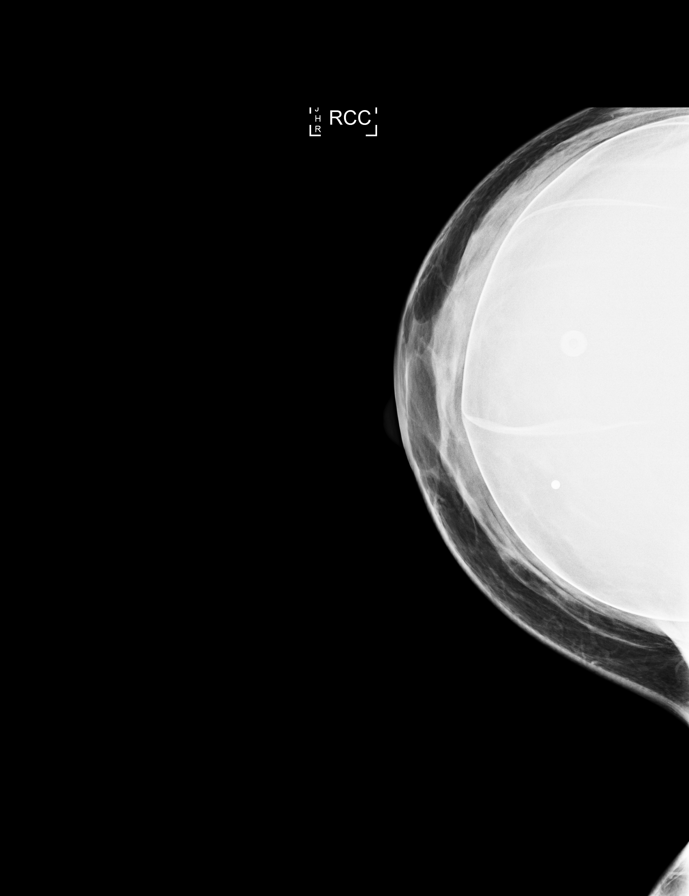

[R CC synth-2D]
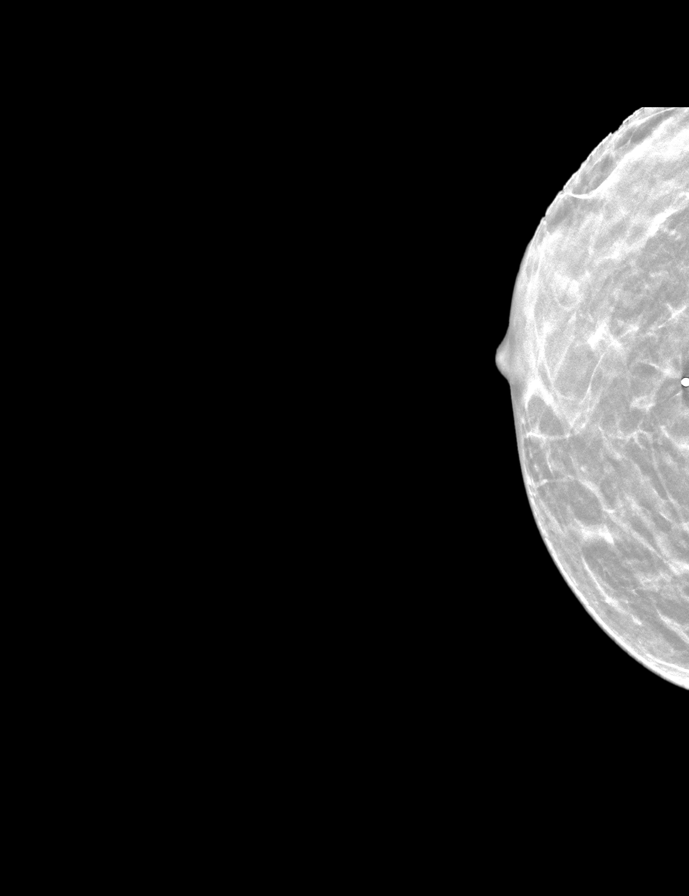

[R MLO synth-2D]
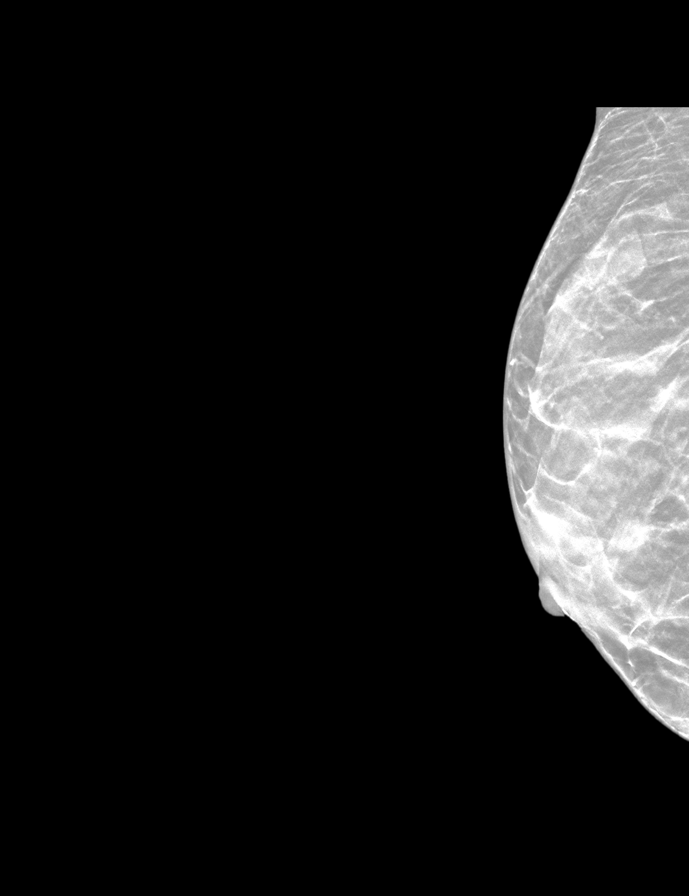

[R TAN]
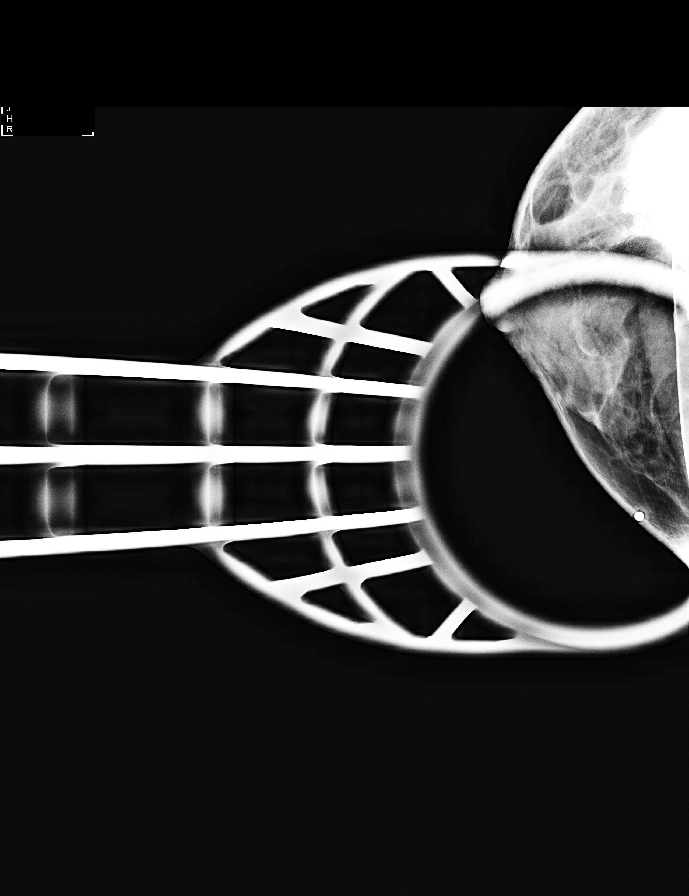

[L CC synth-2D]
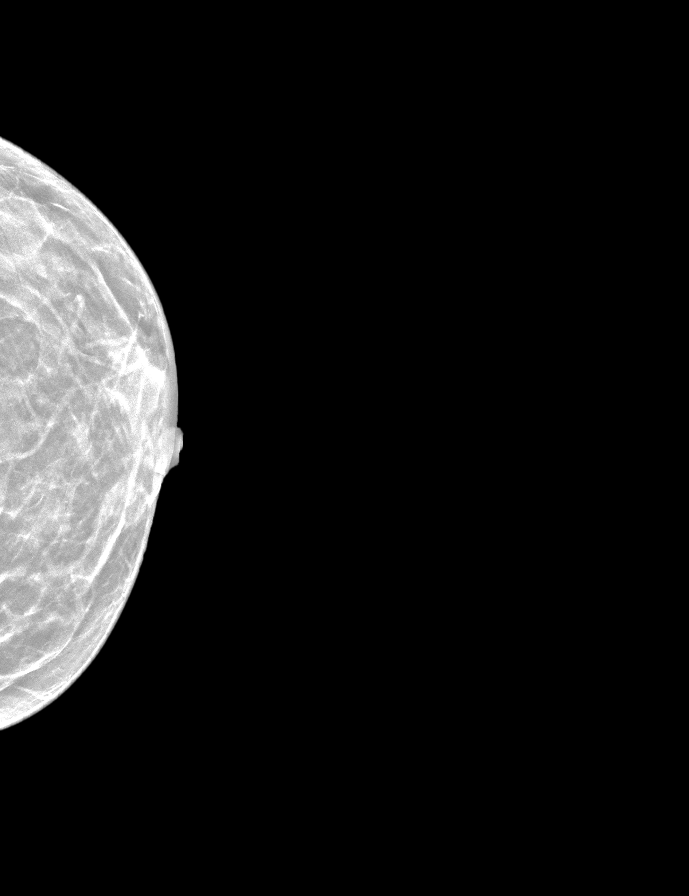

[8 of 39 positions shown; findings below may reference images not displayed]

ACR Breast Density Category c: The breast tissue is heterogeneously
dense, which may obscure small masses.
FINDINGS: Radiopaque BB was placed at the site of the patient's focal pain in
the inferior central right breast. No suspicious mammographic
findings are seen deep to the radiopaque BB or within the remainder
of either breast.

On physical exam, I palpate heterogenous tissue without focal lumps
in the inferior right breast.

Targeted ultrasound is performed, showing normal fibroglandular
tissue without focal or suspicious sonographic finding at the site
of the patient's focal pain.

Mammographic images were processed with CAD.
IMPRESSION: 1. No mammographic evidence of malignancy in either breast.
2. No additional sonographic findings to explain the patient's focal
right breast pain.

RECOMMENDATION:
1. Clinical follow-up recommended for the painful area of concern in
the right breast. Any further workup should be based on clinical
grounds. Benign causes of breast pain, and possible remedies, were
discussed with the patient. Patient was encouraged to follow-up with
referring physician if a palpable lump/mass developed.
2. Screening mammogram at age 40 unless there are persistent or
intervening clinical concerns. (Code:[id number])

I have discussed the findings and recommendations with the patient.
Results were also provided in writing at the conclusion of the
visit. If applicable, a reminder letter will be sent to the patient
regarding the next appointment.

BI-RADS CATEGORY  1: Negative.

## 2018-11-05 ENCOUNTER — Ambulatory Visit (INDEPENDENT_AMBULATORY_CARE_PROVIDER_SITE_OTHER): Payer: Self-pay | Admitting: Plastic Surgery

## 2018-11-05 VITALS — BP 110/60 | HR 74 | Ht 62.0 in | Wt 132.0 lb

## 2018-11-05 DIAGNOSIS — Z719 Counseling, unspecified: Secondary | ICD-10-CM

## 2018-11-08 ENCOUNTER — Institutional Professional Consult (permissible substitution): Payer: Managed Care, Other (non HMO) | Admitting: Plastic Surgery

## 2018-11-14 ENCOUNTER — Ambulatory Visit (INDEPENDENT_AMBULATORY_CARE_PROVIDER_SITE_OTHER): Payer: Self-pay | Admitting: Plastic Surgery

## 2018-11-14 DIAGNOSIS — Z719 Counseling, unspecified: Secondary | ICD-10-CM

## 2018-11-15 ENCOUNTER — Encounter: Payer: Self-pay | Admitting: Plastic Surgery

## 2018-11-15 DIAGNOSIS — Z719 Counseling, unspecified: Secondary | ICD-10-CM | POA: Insufficient documentation

## 2018-11-15 NOTE — Progress Notes (Signed)
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 I discussed with the patient this proposed procedure of botulinum toxin injections, which is customized depending on the particular needs of the patient. It is performed on facial rhytids as a temporary correction. The alternatives were discussed with the patient. The risks were addressed including bleeding, scarring, infection, damage to deeper structures, asymmetry, and chronic pain, which may occur infrequently after a procedure. The individual's choice to undergo a surgical procedure is based on the comparison of risks to potential benefits. Other risks include unsatisfactory results, brow ptosis, eyelid ptosis, allergic reaction, temporary paralysis, which should go away with time, bruising, blurring disturbances and delayed healing. Botulinum toxin injections do not arrest the aging process or produce permanent tightening of the eyelid.  Operative intervention maybe necessary to maintain the results of a blepharoplasty or botulinum toxin. The patient understands and wishes to proceed. An informed consent was signed and informational brochures given to her prior to the procedure.  Procedure: The area was prepped with alcohol and dried with a clean gauze. Using a clean technique, the botulinum toxin was diluted with 1.25 cc of preservative-free normal saline which was slowly injected with an 18 gauge needle in a tuberculin syringes.  A 32 gauge needles were then used to inject the botulinum toxin. This mixture allow for an aliquot of 5 units per 0.1 cc in each injection site.    Subsequently the mixture was injected in the glabellar and forehead area with preservation of the temporal  branch to the lateral eyebrow as well as into each lateral canthal area beginning from the lateral orbital rim medial to the zygomaticus major in 3 separate areas. A total of 75 Units of botulinum toxin was used. The forehead and glabellar area was injected with care to inject intramuscular only while holding pressure on the supratrochlear vessels in each area during each injection on either side of the medial corrugators. No complications were noted. Light pressure was held for 5 minutes. She was instructed explicitly in post-operative care.  Dysport LOT:  [id number] EXP:  06/27/19

## 2018-11-28 ENCOUNTER — Encounter: Payer: Self-pay | Admitting: Plastic Surgery

## 2018-11-28 NOTE — Progress Notes (Signed)
     Patient ID: Colleen Harris, female    DOB: 10/09/1986, 33 y.o.   MRN: [id number]   Chief Complaint  Patient presents with  . Advice Only    filler    The patient is a 33 yrs old wf here for consultation for facial rejuvenation.  She is interested in botox for reduction of facial / forehead lines.  She had filler in the past and was overall pleased.  She had filler to the upper lip and has noticed a bump that has been present for 2 years.  It is not tender but she finds herself focusing on it.  The pressure on her teeth is noticeable to her.  There is no sign of infection.  The skin over the area seems thin.  There has not been any trauma to the area.   Review of Systems  Constitutional: Negative.   HENT: Negative.   Eyes: Negative.   Respiratory: Negative.   Cardiovascular: Negative.   Gastrointestinal: Negative.   Endocrine: Negative.   Genitourinary: Negative.   Musculoskeletal: Negative.   Skin: Negative.  Negative for color change and wound.  Psychiatric/Behavioral: Negative.     Past Medical History:  Diagnosis Date  . 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 AUGMENTATION MAMMAPLASTY Bilateral 2011  . BREAST SURGERY  07/29/2011   AUGMENTATION      Current Outpatient Medications:  .  busPIRone (BUSPAR) 30 MG tablet, Take 30 mg by mouth daily., Disp: , Rfl: 3 .  citalopram (CELEXA) 10 MG tablet, Take 10 mg by mouth daily., Disp: , Rfl:  .  ergocalciferol (VITAMIN D2) 1.25 MG (50000 UT) capsule, Take 50,000 Units by mouth once a week., Disp: , Rfl:  .  levonorgestrel (MIRENA) 20 MCG/24HR IUD, 1 each by Intrauterine route once., Disp: , Rfl:    Objective:   Vitals:   11/05/18 1333  BP: 110/60  Pulse: 74  SpO2: 96%    Physical Exam Vitals signs and nursing note reviewed.  Constitutional:      Appearance: Normal appearance.  HENT:     Head: Normocephalic.     Nose: Nose normal.     Mouth/Throat:   Mouth: Mucous membranes are moist.  Eyes:     Extraocular Movements: Extraocular movements intact.     Pupils: Pupils are equal, round, and reactive to light.  Cardiovascular:     Rate and Rhythm: Normal rate.  Skin:    General: Skin is warm.  Neurological:     General: No focal deficit present.     Mental Status: She is alert.  Psychiatric:        Mood and Affect: Mood normal.        Thought Content: Thought content normal.        Judgment: Judgment normal.     Assessment & Plan:  Encounter for counseling We discussed options for her lip.  Excision of the area is one option.  We can also try to dissolve the filler.  She is going to think about it.  Alena Bills , DO

## 2018-12-18 NOTE — Progress Notes (Signed)
The patient has a 5 mm changing skin lesion on her lower lip.  Patient states it has been present for over 2 years.  It seems to be getting slightly bigger and sometimes . She is concerned about the way that it looks.  It looks slightly cystic in nature but concerning.  Nothing makes it better and it seems to be getting slightly larger in time.  We discussed options for waiting, watching and excision.  Due to how long its been present the patient is interested in having it excised.

## 2021-04-27 ENCOUNTER — Other Ambulatory Visit: Payer: Self-pay

## 2021-04-27 ENCOUNTER — Ambulatory Visit (INDEPENDENT_AMBULATORY_CARE_PROVIDER_SITE_OTHER): Payer: Self-pay | Admitting: Plastic Surgery

## 2021-04-27 DIAGNOSIS — Z411 Encounter for cosmetic surgery: Secondary | ICD-10-CM

## 2021-04-27 NOTE — Progress Notes (Signed)
Patient presents to discuss Botox treatment.  She is bothered by what are mostly dynamic lines in the forehead, glabella and crows feet.  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  She has gotten Botox treatment in the past on a regular basis with varying amounts.  She is interested in additional treatment today.  The risks and benefits of Botox treatment were discussed and she is interested in moving forward.  The forehead, glabella, crows feet and cheeks were prepped with an alcohol pad.  Around 40 units of Botox were distributed throughout the crows feet, glabella and forehead and 20 units of Botox were then split between the right and left masseter muscle which were injected at the area of most significant prominence which is just above the angle of the mandible on both sides.  I injected slightly more than half of the 20 units on the right side as this was a problem side.  She tolerated this well and I will plan to see her again on an as-needed basis.

## 2021-08-03 ENCOUNTER — Ambulatory Visit (INDEPENDENT_AMBULATORY_CARE_PROVIDER_SITE_OTHER): Payer: Self-pay | Admitting: Plastic Surgery

## 2021-08-03 ENCOUNTER — Other Ambulatory Visit: Payer: Self-pay

## 2021-08-03 DIAGNOSIS — Z411 Encounter for cosmetic surgery: Secondary | ICD-10-CM

## 2021-08-03 NOTE — Progress Notes (Signed)
Patient presents to discuss additional Botox treatment.  We did 40 units throughout the forehead, glabella and crows feet last time and she liked the results but would like to tailor a bit more into the crows feet area.  The Botox we put into her masseter muscle had limited benefit but she has found someone to do massage in that area which seems to be helping with her TMJ related issues.  We reviewed the risks and benefits of post tox treatment and she elected to move forward.  44 units of Botox were distributed throughout the forehead, glabella and crows feet.  She tolerated this fine.  We will plan to see her for any touchups and otherwise at her next visit.

## 2021-09-12 ENCOUNTER — Encounter: Payer: Self-pay | Admitting: Plastic Surgery

## 2021-09-12 ENCOUNTER — Other Ambulatory Visit: Payer: Self-pay

## 2021-09-12 ENCOUNTER — Ambulatory Visit (INDEPENDENT_AMBULATORY_CARE_PROVIDER_SITE_OTHER): Payer: Managed Care, Other (non HMO) | Admitting: Plastic Surgery

## 2021-09-12 VITALS — BP 113/73 | HR 84 | Ht 62.0 in | Wt 134.2 lb

## 2021-09-12 DIAGNOSIS — R1031 Right lower quadrant pain: Secondary | ICD-10-CM | POA: Diagnosis not present

## 2021-09-12 NOTE — Progress Notes (Signed)
   Referring Provider No referring provider defined for this encounter.   CC:  Chief Complaint  Patient presents with   Advice Only      Colleen Harris is an 35 y.o. female.  HPI: Patient presents to discuss right lower quadrant pain and a widened abdominoplasty scar.  She had abdominoplasty about a year ago.  Over the past few months she has noticed some pain and swelling in the right inguinal area.  She is seen her primary provider who suspected a right inguinal hernia.  She is also interested in having her abdominoplasty scar revised as it has widened over time.  She is here for evaluation for those issues.  Allergies  Allergen Reactions   Penicillins Rash    rx occurred when she was a baby     Outpatient Encounter Medications as of 09/12/2021  Medication Sig   buPROPion (WELLBUTRIN XL) 150 MG 24 hr tablet bupropion HCl XL 150 mg 24 hr tablet, extended release  TAKE 1 TABLET BY MOUTH EVERY DAY IN THE MORNING   citalopram (CELEXA) 10 MG tablet Take 10 mg by mouth daily.   ergocalciferol (VITAMIN D2) 1.25 MG (50000 UT) capsule Take 50,000 Units by mouth once a week.   levonorgestrel (KYLEENA) 19.5 MG IUD 1 Intra Uterine Device by Intrauterine route once.   No facility-administered encounter medications on file as of 09/12/2021.     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    Past Surgical History:  Procedure Laterality Date   AUGMENTATION MAMMAPLASTY Bilateral 2011   BREAST SURGERY  07/29/2011   AUGMENTATION    Family History  Problem Relation Age of Onset   Hypertension Mother    Heart disease Maternal Grandmother     Social History   Social History Narrative   Not on file     Review of Systems General: Denies fevers, chills, weight loss CV: Denies chest pain, shortness of breath, palpitations  Physical Exam Vitals with BMI 09/12/2021 11/14/2018 11/05/2018  Height 5\' 2"  5\' 2"  5\' 2"   Weight 134 lbs 3 oz 132 lbs 132  lbs  BMI 24.54 24.14 24.14  Systolic 113 110  Diastolic 73 60 60  Pulse 84 74 74    General:  No acute distress,  Alert and oriented, Non-Toxic, Normal speech and affect Abdomen: Abdomen is soft nontender.  She has an overall nice contour from her abdominoplasty.  The scar has widened a bit and there is very subtle wrinkling at the corners.  There may be a faint bulge in the right lower quadrant area which she points out.  The area is certainly tender.  I cannot say for sure if there is an hernia defect there.  Assessment/Plan Patient presents with right lower quadrant pain and a widened abdominoplasty scar.  We will plan to do a CT scan to look for right inguinal hernia.  If present we could do a coordinated hernia repair with revision of her abdominoplasty scar.  I do think that just excising the scar and closing it would be sufficient.  This time around there would not be the tension that she had before.  We discussed risks include bleeding, infection, damage to surrounding structures need for additional procedures.  All of her questions were answered we will plan to move ahead.  09/12/2021, 11:22 AM

## 2021-09-15 ENCOUNTER — Institutional Professional Consult (permissible substitution): Payer: Managed Care, Other (non HMO) | Admitting: Plastic Surgery

## 2021-10-03 ENCOUNTER — Ambulatory Visit (INDEPENDENT_AMBULATORY_CARE_PROVIDER_SITE_OTHER): Payer: Self-pay | Admitting: Surgical

## 2021-10-03 ENCOUNTER — Other Ambulatory Visit: Payer: Self-pay

## 2021-10-03 ENCOUNTER — Ambulatory Visit: Payer: Managed Care, Other (non HMO)

## 2021-10-03 DIAGNOSIS — Z411 Encounter for cosmetic surgery: Secondary | ICD-10-CM

## 2021-10-03 NOTE — Progress Notes (Signed)
Preoperative Dx: Sun damaged skin  Postoperative Dx:  same  Procedure: laser to face, neck and chest   Anesthesia: 4% lidocaine cream  Description of Procedure:  Risks and complications were explained to the patient. Consent was confirmed and signed. Time out was called and all information was confirmed to be correct. The area  area was prepped with alcohol and wiped dry. The BBL Sciton laser was set at 9 J/cm2. The face, neck and chest were lasered. The patient tolerated the procedure well and there were no complications.

## 2021-11-02 ENCOUNTER — Ambulatory Visit: Payer: Managed Care, Other (non HMO)

## 2021-11-08 ENCOUNTER — Encounter (HOSPITAL_BASED_OUTPATIENT_CLINIC_OR_DEPARTMENT_OTHER): Payer: Self-pay

## 2021-11-08 ENCOUNTER — Other Ambulatory Visit: Payer: Self-pay

## 2021-11-08 ENCOUNTER — Ambulatory Visit (HOSPITAL_BASED_OUTPATIENT_CLINIC_OR_DEPARTMENT_OTHER)
Admission: RE | Admit: 2021-11-08 | Discharge: 2021-11-08 | Disposition: A | Discharge status: Home / Self Care | Payer: Managed Care, Other (non HMO) | Source: Ambulatory Visit | Attending: Plastic Surgery | Admitting: Plastic Surgery

## 2021-11-08 DIAGNOSIS — R1031 Right lower quadrant pain: Secondary | ICD-10-CM | POA: Insufficient documentation

## 2021-11-16 ENCOUNTER — Other Ambulatory Visit: Payer: Managed Care, Other (non HMO) | Admitting: Surgical

## 2022-06-20 IMAGING — CT CT ABD-PELV W/O CM
2 of 4 series · 16 of 46 positions shown, 18 images · non-contrast
Comparison: None.

CLINICAL DATA: Abdominal pain, concern for hernia, right lower
quadrant pain for 4 months. History of breast augmentation and
abdominal plasty

EXAM:
CT ABDOMEN AND PELVIS WITHOUT CONTRAST
TECHNIQUE: Multidetector CT imaging of the abdomen and pelvis was performed
following the standard protocol without IV contrast.

[Series 2: abd pel wo · axial · 0.72mm/px · z∈[+1012,+1397]mm · 13 of 85 slices shown, 15 images]
[im 4/85  soft-tissue]
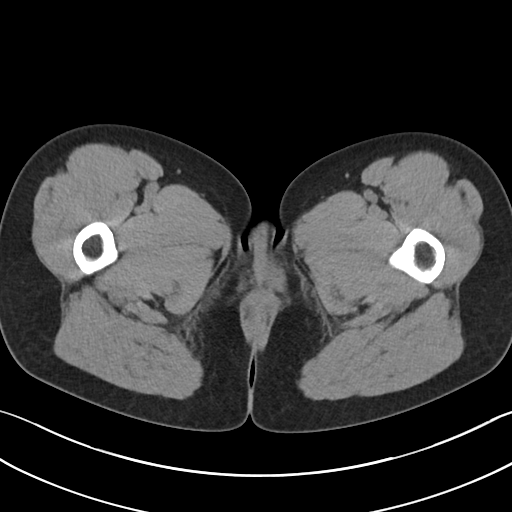
[im 4/85  bone]
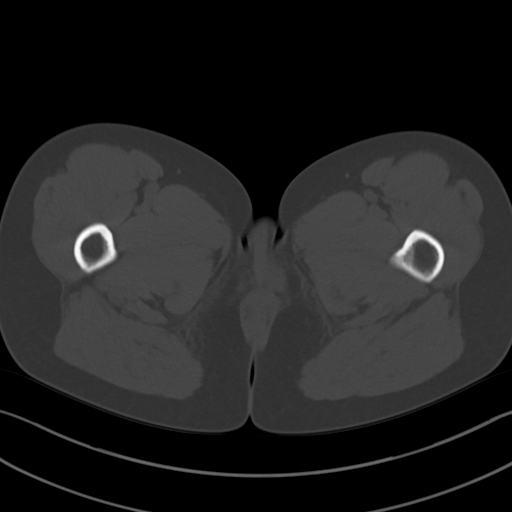
[im 11/85  soft-tissue]
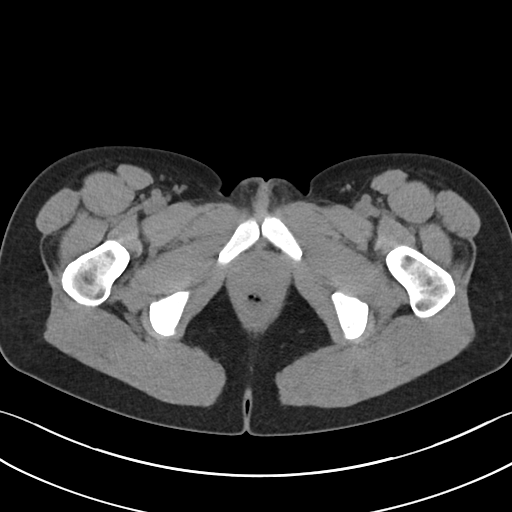
[im 17/85  soft-tissue]
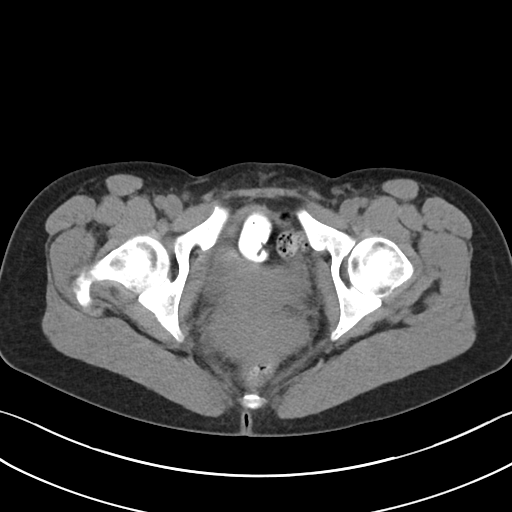
[im 24/85  soft-tissue]
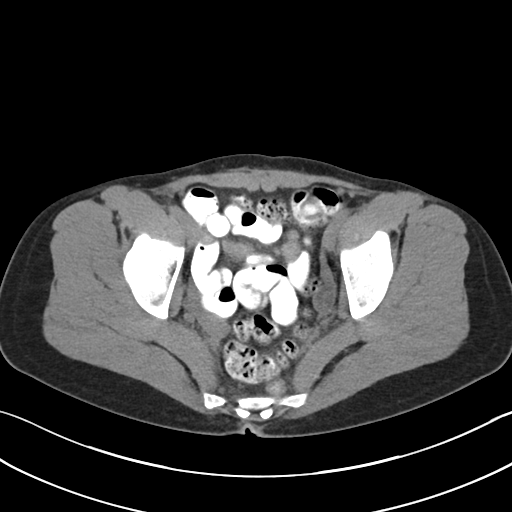
[im 31/85  soft-tissue]
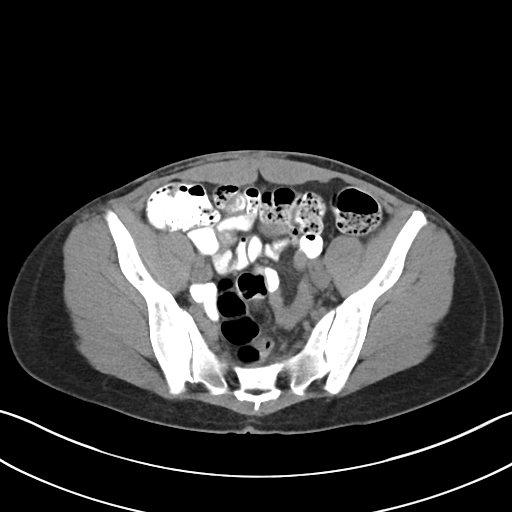
[im 37/85  soft-tissue]
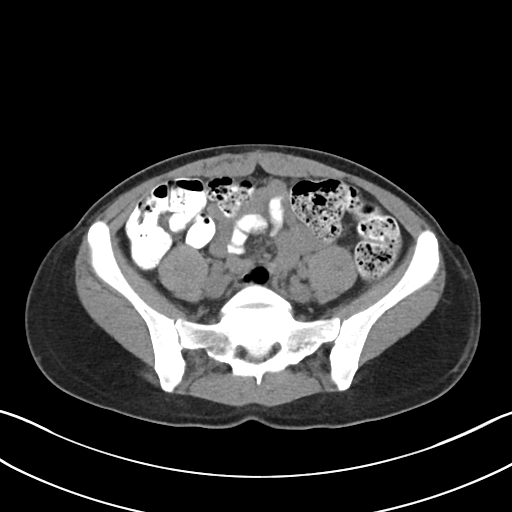
[im 44/85  soft-tissue]
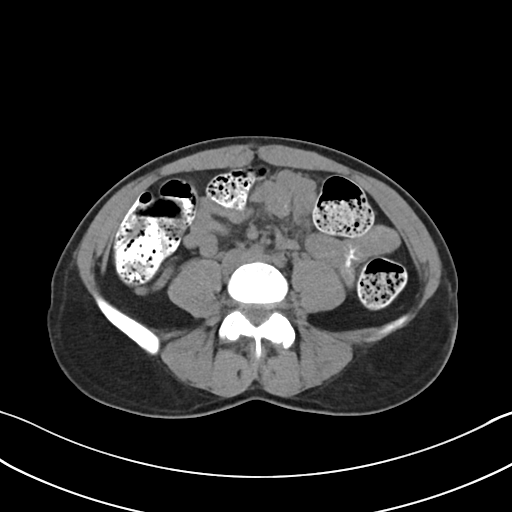
[im 48/85  soft-tissue]
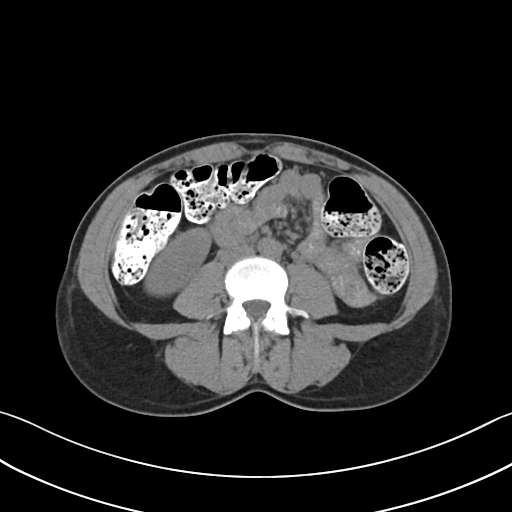
[im 54/85  soft-tissue]
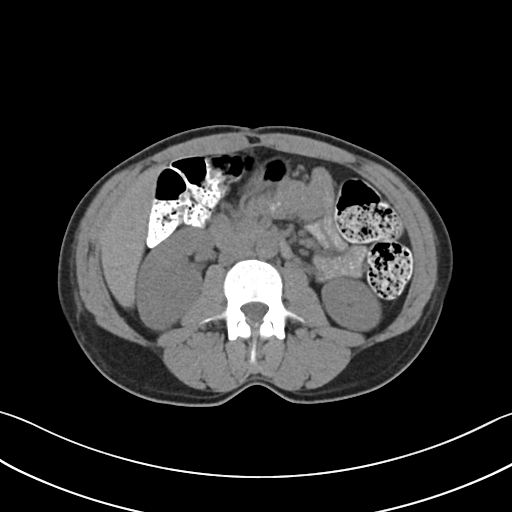
[im 54/85  bone]
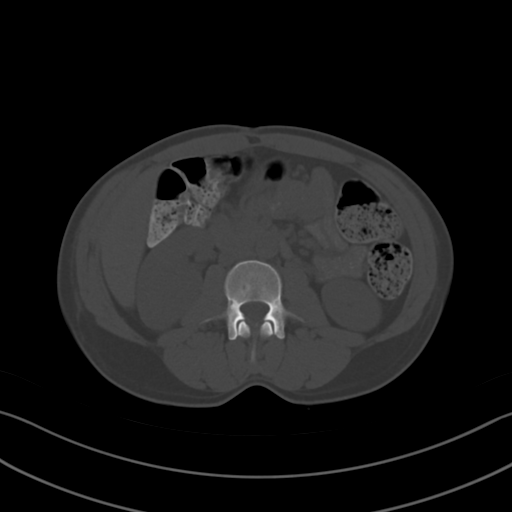
[im 61/85  soft-tissue]
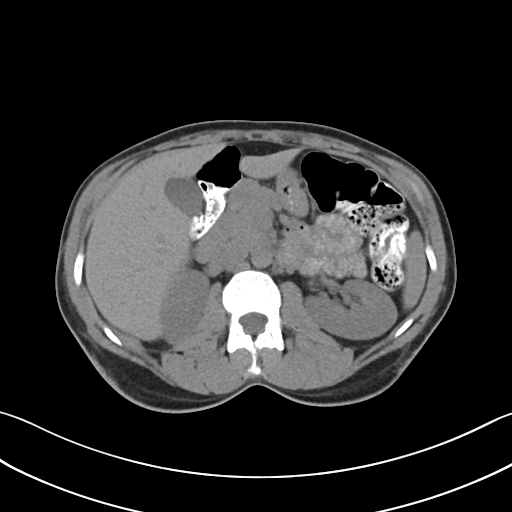
[im 68/85  soft-tissue]
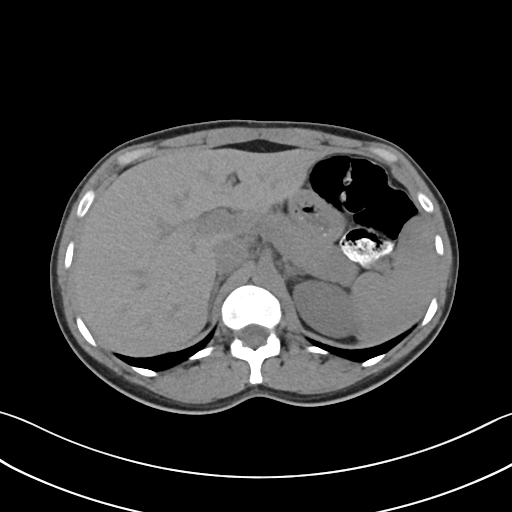
[im 74/85  soft-tissue]
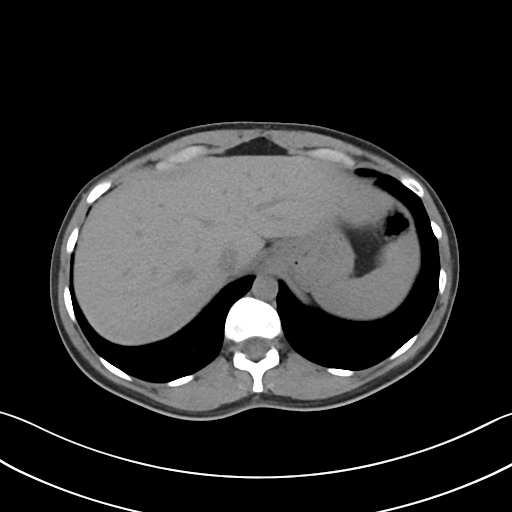
[im 81/85  soft-tissue]
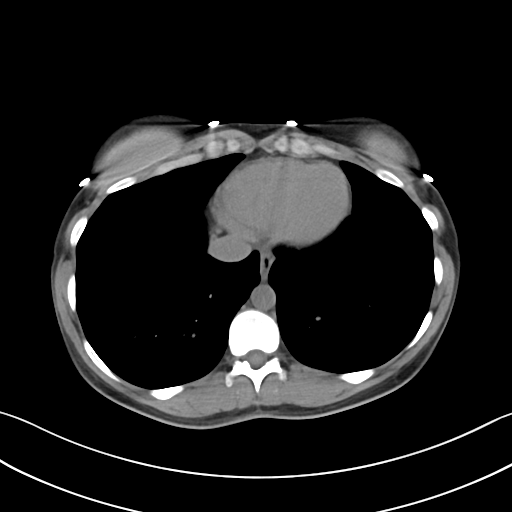

[Series 5: coronal · coronal · 0.75mm/px · 3 of 81 slices shown]
[im 27/81  soft-tissue]
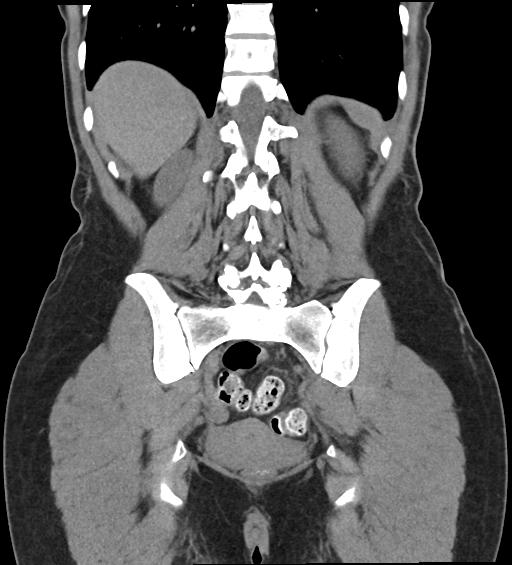
[im 36/81  soft-tissue]
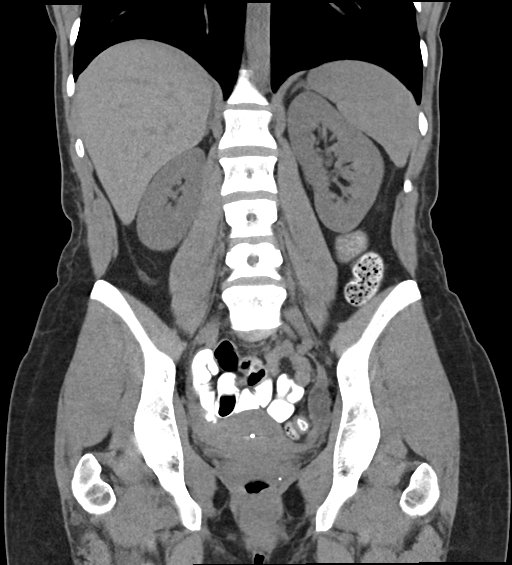
[im 45/81  soft-tissue]
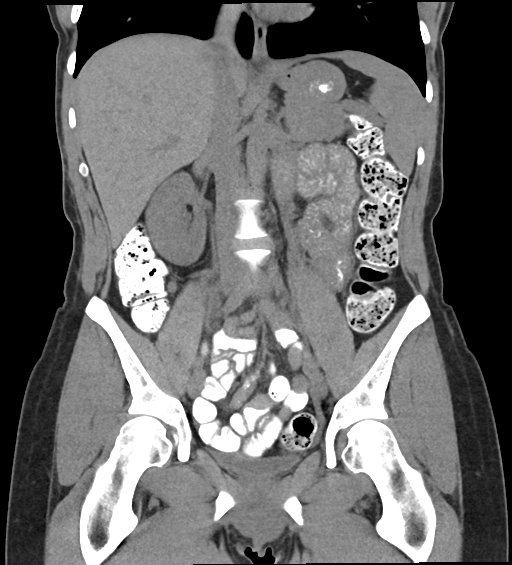

[16 of 46 positions shown; findings below may reference images not displayed]

FINDINGS: Lower chest: No acute abnormality.

Hepatobiliary: No focal liver abnormality is seen. No gallstones,
gallbladder wall thickening, or biliary dilatation.

Pancreas: Unremarkable. No pancreatic ductal dilatation or
surrounding inflammatory changes.

Spleen: Normal in size without focal abnormality.

Adrenals/Urinary Tract: Adrenal glands are unremarkable. Kidneys are
normal, without renal calculi, focal lesion, or hydronephrosis.
Bladder is unremarkable.

Stomach/Bowel: Stomach is within normal limits. Appendix appears
normal. No evidence of bowel wall thickening, distention, or
inflammatory changes.

Vascular/Lymphatic: Limited without IV contrast. Negative for
aneurysm. No bulky adenopathy.

Reproductive: Uterus and adnexa normal in size. IUD device noted
centered within the endometrial cavity. No pelvic free fluid, fluid
collection, or abscess.

Other: No abdominal wall hernia or abnormality. No abdominopelvic
ascites.

Musculoskeletal: No acute osseous finding. L4-5 and L5-S1 facet
arthropathy. Minimal anterolisthesis of L5 upon S1 with disc space
narrowing. This appears facet mediated.
IMPRESSION: No acute intra-abdominal or pelvic finding by noncontrast imaging.

Normal appendix.

Intact abdominal wall. Negative for hernia. No inguinal abnormality.

## 2023-04-19 ENCOUNTER — Encounter: Payer: Self-pay | Admitting: Family Medicine

## 2023-04-19 ENCOUNTER — Ambulatory Visit: Payer: Self-pay | Admitting: Family Medicine

## 2023-04-19 NOTE — Progress Notes (Signed)
Pt stated that she was told that we have Kyleena IUD's both today and when she called to the appointment.  I informed pt that we had Liletta, Paragard and Mirena devices.  Pt was very upset and left without being seen by a Provider.

## 2023-10-15 DIAGNOSIS — R1909 Other intra-abdominal and pelvic swelling, mass and lump: Secondary | ICD-10-CM | POA: Diagnosis not present

## 2024-01-07 ENCOUNTER — Other Ambulatory Visit: Payer: Self-pay

## 2024-01-07 DIAGNOSIS — Z1231 Encounter for screening mammogram for malignant neoplasm of breast: Secondary | ICD-10-CM

## 2024-05-13 ENCOUNTER — Ambulatory Visit
Admission: RE | Admit: 2024-05-13 | Discharge: 2024-05-13 | Disposition: A | Discharge status: Home / Self Care | Source: Ambulatory Visit

## 2024-05-13 DIAGNOSIS — Z1231 Encounter for screening mammogram for malignant neoplasm of breast: Secondary | ICD-10-CM | POA: Insufficient documentation

## 2024-08-13 ENCOUNTER — Ambulatory Visit (INDEPENDENT_AMBULATORY_CARE_PROVIDER_SITE_OTHER): Admitting: Physician Assistant

## 2024-08-13 ENCOUNTER — Encounter: Payer: Self-pay | Admitting: Physician Assistant

## 2024-08-13 ENCOUNTER — Other Ambulatory Visit: Payer: Self-pay | Admitting: Physician Assistant

## 2024-08-13 DIAGNOSIS — L239 Allergic contact dermatitis, unspecified cause: Secondary | ICD-10-CM

## 2024-08-13 DIAGNOSIS — L237 Allergic contact dermatitis due to plants, except food: Secondary | ICD-10-CM | POA: Diagnosis not present

## 2024-08-13 MED ORDER — METHYLPREDNISOLONE 4 MG PO TBPK: ORAL_TABLET | ORAL | 0 refills | Status: AC

## 2024-08-13 MED ORDER — DOXYCYCLINE HYCLATE 100 MG PO CAPS: ORAL_CAPSULE | ORAL | 0 refills | Status: AC

## 2024-08-13 MED ORDER — DOXYCYCLINE HYCLATE 100 MG PO CAPS: 100.0000 mg | ORAL_CAPSULE | Freq: Every day | ORAL | 0 refills | Status: DC

## 2024-08-13 MED ORDER — CLOBETASOL PROPIONATE 0.05 % EX CREA: 1.0000 | TOPICAL_CREAM | Freq: Two times a day (BID) | CUTANEOUS | 0 refills | Status: AC

## 2024-08-13 NOTE — Progress Notes (Deleted)
   New Patient Visit   Subjective  Colleen Harris is a 38 y.o. female who presents for the following:  Total Body Skin Exam (TBSE)  Patient present today for new patient visit for TBSE.The patient {Actions; denies-reports:120008} she has spots, moles and lesions to be evaluated, some may be new or changing and the patient may have concern these could be cancer. Patient {HAS/HAS NOT:20194} previously been treated by a dermatologist.Patient reports she {ACTIONS; HAS/DOES NOT HAVE:19233} hx of bx. Patient {Actions; denies/reports/admits to:19208} family history of skin cancers. Patient reports throughout her lifetime has had {no/min/mod:60509::no} sun exposure. Currently, patient reports if she has excessive sun exposure, she {DOES_DOES WNU:81435} apply sunscreen and/or wears protective coverings.  The following portions of the chart were reviewed this encounter and updated as appropriate: medications, allergies, medical history  Review of Systems:  No other skin or systemic complaints except as noted in HPI or Assessment and Plan.  Objective  Well appearing patient in no apparent distress; mood and affect are within normal limits.  A full examination was performed including scalp, head, eyes, ears, nose, lips, neck, chest, axillae, abdomen, back, buttocks, bilateral upper extremities, bilateral lower extremities, hands, feet, fingers, toes, fingernails, and toenails. All findings within normal limits unless otherwise noted below.     Relevant exam findings are noted in the Assessment and Plan.    Assessment & Plan   LENTIGINES, SEBORRHEIC KERATOSES, HEMANGIOMAS - Benign normal skin lesions - Benign-appearing - Call for any changes  MELANOCYTIC NEVI - Tan-brown and/or pink-flesh-colored symmetric macules and papules - Benign appearing on exam today - Observation - Call clinic for new or changing moles - Recommend daily use of broad spectrum spf 30+ sunscreen to sun-exposed areas.    ACTINIC DAMAGE - Chronic condition, secondary to cumulative UV/sun exposure - diffuse scaly erythematous macules with underlying dyspigmentation - Recommend daily broad spectrum sunscreen SPF 30+ to sun-exposed areas, reapply every 2 hours as needed.  - Staying in the shade or wearing long sleeves, sun glasses (UVA+UVB protection) and wide brim hats (4-inch brim around the entire circumference of the hat) are also recommended for sun protection.  - Call for new or changing lesions.  SKIN CANCER SCREENING PERFORMED TODAY   No follow-ups on file. I, Doyce Pan, CMA, am acting as scribe for SANDRIDGE,BRENDA K, PA-C.   Documentation: I have reviewed the above documentation for accuracy and completeness, and I agree with the above.  SANDRIDGE,BRENDA K, PA-C

## 2024-08-13 NOTE — Patient Instructions (Signed)

## 2024-08-13 NOTE — Progress Notes (Signed)
   New Patient Visit   Subjective  Colleen Harris is a 38 y.o. female NEW PATIENT who presents for the following: Rash  Patient states she has rash located at the lower legs that she would like to have examined. Patient reports the areas have been there for 2 weeks. She reports the areas are bothersome. Patient reports that it itch's really bad. Patient rates irritation 10 out of 10. She states that the areas have spread to both legs. Patient reports she has previously been treated for these areas. Patient reports that she has had a round of Doxy and prednisolone and clobetasol  ointment and she used some over the counter poison ivy medication.  Patient denies Hx of bx. Patient denies family history of skin cancer(s).    The following portions of the chart were reviewed this encounter and updated as appropriate: medications, allergies, medical history  Review of Systems:  No other skin or systemic complaints except as noted in HPI or Assessment and Plan.  Objective  Well appearing patient in no apparent distress; mood and affect are within normal limits.   A focused examination was performed of the following areas: face, trunk and extremities            Relevant exam findings are noted in the Assessment and Plan.    Assessment & Plan   ALLERGIC CONTACT DERMATITIS Exam: linear scaly pink papules and/or plaques +/- vesiculation  Secondary to poison ivy or poison oak exposure  Treatment Plan: - Anaerobic culture performed - start oral Doxycycline  as directed - Start prednisone dose pack (I preferred 21 day course of oral steroids but patient can only take methylprednisolone  and they only come in dose packs - so patient agreed to try another short course +  topical steroids).     ALLERGIC CONTACT DERMATITIS, UNSPECIFIED TRIGGER   Related Procedures Aerobic Culture Related Medications methylPREDNISolone  (MEDROL  DOSEPAK) 4 MG TBPK tablet Take as directed  Return in  about 3 weeks (around 09/03/2024) for TBSE follow up .  I, Doyce Pan, CMA, am acting as scribe for Carrianne Hyun K, PA-C.   Documentation: I have reviewed the above documentation for accuracy and completeness, and I agree with the above.  Tracen Mahler K, PA-C

## 2024-08-16 ENCOUNTER — Ambulatory Visit: Payer: Self-pay | Admitting: Physician Assistant

## 2024-08-16 LAB — AEROBIC CULTURE

## 2024-09-02 ENCOUNTER — Ambulatory Visit: Admitting: Physician Assistant

## 2024-09-02 ENCOUNTER — Encounter: Payer: Self-pay | Admitting: Physician Assistant

## 2024-09-02 VITALS — BP 103/69 | HR 71

## 2024-09-02 DIAGNOSIS — L858 Other specified epidermal thickening: Secondary | ICD-10-CM | POA: Diagnosis not present

## 2024-09-02 DIAGNOSIS — L814 Other melanin hyperpigmentation: Secondary | ICD-10-CM

## 2024-09-02 DIAGNOSIS — W908XXA Exposure to other nonionizing radiation, initial encounter: Secondary | ICD-10-CM

## 2024-09-02 DIAGNOSIS — Z1283 Encounter for screening for malignant neoplasm of skin: Secondary | ICD-10-CM | POA: Diagnosis not present

## 2024-09-02 DIAGNOSIS — L237 Allergic contact dermatitis due to plants, except food: Secondary | ICD-10-CM

## 2024-09-02 DIAGNOSIS — L821 Other seborrheic keratosis: Secondary | ICD-10-CM | POA: Diagnosis not present

## 2024-09-02 DIAGNOSIS — D1801 Hemangioma of skin and subcutaneous tissue: Secondary | ICD-10-CM

## 2024-09-02 DIAGNOSIS — D2239 Melanocytic nevi of other parts of face: Secondary | ICD-10-CM

## 2024-09-02 DIAGNOSIS — L578 Other skin changes due to chronic exposure to nonionizing radiation: Secondary | ICD-10-CM

## 2024-09-02 MED ORDER — TRETINOIN 0.1 % EX CREA: TOPICAL_CREAM | Freq: Every day | CUTANEOUS | 2 refills | Status: AC

## 2024-09-02 NOTE — Progress Notes (Signed)
 Total Body Skin Exam (TBSE) Visit   Subjective  Colleen Harris is a 38 y.o. female ESTABLISHED PATIENT who presents for the following: Skin Cancer Screening and Full Body Skin Exam  Patient presents today for follow up visit for TBSE. Patient was last evaluated on 08/13/24 where she was diagnosed and treated for poison ivy. Reports almost 98% improved.  NO HISTORY of skin cancer. Has been routinely seen annually at [facility] in [place], [place].   The following portions of the chart were reviewed this encounter and updated as appropriate: medications, allergies, medical history  Review of Systems:  No other skin or systemic complaints except as noted in HPI or Assessment and Plan.  Objective  Well appearing patient in no apparent distress; mood and affect are within normal limits.  A full examination was performed including scalp, head, eyes, ears, nose, lips, neck, chest, axillae, abdomen, back, buttocks, bilateral upper extremities, bilateral lower extremities, hands, feet, fingers, toes, fingernails, and toenails. All findings within normal limits unless otherwise noted below.   Relevant physical exam findings are noted in the Assessment and Plan.    Assessment & Plan   LENTIGINES, SEBORRHEIC KERATOSES, HEMANGIOMAS - Benign normal skin lesions - Benign-appearing - Call for any changes  MELANOCYTIC NEVI - Tan-brown and/or pink-flesh-colored symmetric macules and papules - Benign appearing on exam today - Observation - Call clinic for new or changing moles - Recommend daily use of broad spectrum spf 30+ sunscreen to sun-exposed areas.   ACTINIC DAMAGE - Chronic condition, secondary to cumulative UV/sun exposure - diffuse scaly erythematous macules with underlying dyspigmentation - Recommend daily broad spectrum sunscreen SPF 30+ to sun-exposed areas, reapply every 2 hours as needed.  - Staying in the shade or wearing long sleeves, sun glasses (UVA+UVB protection)  and wide brim hats (4-inch brim around the entire circumference of the hat) are also recommended for sun protection.  - Call for new or changing lesions.  SKIN CANCER SCREENING PERFORMED TODAY.  KERATOSIS PILARIS - Tiny follicular keratotic papules - Benign. Genetic in nature. No cure. - If desired, patient can use an emollient (moisturizer) containing ammonium lactate (AmLactin), urea or salicylic acid once a day to smooth the area  Recommend starting moisturizer with exfoliant (Urea, Salicylic acid, or Lactic acid) one to two times daily to help smooth rough and bumpy skin.  OTC options include Cetaphil Rough and Bumpy lotion (Urea), Eucerin Roughness Relief lotion or spot treatment cream (Urea), CeraVe SA lotion/cream for Rough and Bumpy skin (Sal Acid), Gold Bond Rough and Bumpy cream (Sal Acid), and AmLactin 12% lotion/cream (Lactic Acid).  If applying in morning, also apply sunscreen to sun-exposed areas, since these exfoliating moisturizers can increase sensitivity to sun.    Treatment plan: tretinoin 0.1% nightly or every other night as can tolerate. Discussed that this use is off label. She voiced her understanding.   MELANOCYTIC NEVUS - RIGHT INNER CHEEK - 0.6 cm Exam: Tan-brown papule   Treatment Plan: Benign appearing on exam today. Recommend observation. Call clinic for new or changing moles. Recommend daily use of broad spectrum spf 30+ sunscreen to sun-exposed areas.    POISON IVY DERMATITIS - vastly improved  - can continue topical steroid for a few more days then discontinue    KERATOSIS PILARIS   Related Medications tretinoin (RETIN-A) 0.1 % cream Apply topically at bedtime. LENTIGINES   SEBORRHEIC KERATOSIS   CHERRY ANGIOMA   ACTINIC SKIN DAMAGE   SCREENING EXAM FOR SKIN CANCER   POISON  IVY DERMATITIS   Return in about 1 year (around 09/02/2025) for TBSE follow up (Sign for release of records of dermatology).  I, Doyce Pan, CMA, am acting as  scribe for Judee Hennick K, PA-C.   Documentation: I have reviewed the above documentation for accuracy and completeness, and I agree with the above.  Mikhia Dusek K, PA-C

## 2024-09-02 NOTE — Patient Instructions (Signed)

## 2025-01-29 ENCOUNTER — Ambulatory Visit: Admitting: Family Medicine

## 2025-09-07 ENCOUNTER — Ambulatory Visit: Admitting: Physician Assistant
